# Patient Record
Sex: Male | Born: 1940 | Race: White | Hispanic: No | Smoking: Former smoker
Health system: Southern US, Community
[De-identification: ages and names within clinical notes are randomized; demographics above are authoritative.]

## PROBLEM LIST (undated history)

## (undated) DIAGNOSIS — F329 Major depressive disorder, single episode, unspecified: Secondary | ICD-10-CM

## (undated) DIAGNOSIS — I714 Abdominal aortic aneurysm, without rupture, unspecified: Secondary | ICD-10-CM

## (undated) DIAGNOSIS — I10 Essential (primary) hypertension: Secondary | ICD-10-CM

## (undated) DIAGNOSIS — M2010 Hallux valgus (acquired), unspecified foot: Secondary | ICD-10-CM

## (undated) DIAGNOSIS — F32A Depression, unspecified: Secondary | ICD-10-CM

## (undated) DIAGNOSIS — R51 Headache: Secondary | ICD-10-CM

## (undated) DIAGNOSIS — I2510 Atherosclerotic heart disease of native coronary artery without angina pectoris: Secondary | ICD-10-CM

## (undated) DIAGNOSIS — G459 Transient cerebral ischemic attack, unspecified: Secondary | ICD-10-CM

## (undated) DIAGNOSIS — K219 Gastro-esophageal reflux disease without esophagitis: Secondary | ICD-10-CM

## (undated) DIAGNOSIS — E785 Hyperlipidemia, unspecified: Secondary | ICD-10-CM

## (undated) DIAGNOSIS — K2270 Barrett's esophagus without dysplasia: Secondary | ICD-10-CM

## (undated) DIAGNOSIS — R519 Headache, unspecified: Secondary | ICD-10-CM

## (undated) DIAGNOSIS — J449 Chronic obstructive pulmonary disease, unspecified: Secondary | ICD-10-CM

## (undated) DIAGNOSIS — J45909 Unspecified asthma, uncomplicated: Secondary | ICD-10-CM

## (undated) HISTORY — PX: CORONARY STENT PLACEMENT: SHX1402

## (undated) HISTORY — PX: PROSTATE SURGERY: SHX751

## (undated) HISTORY — PX: SHOULDER SURGERY: SHX246

## (undated) HISTORY — PX: HERNIA REPAIR: SHX51

## (undated) HISTORY — PX: OTHER SURGICAL HISTORY: SHX169

## (undated) HISTORY — PX: UPPER GASTROINTESTINAL ENDOSCOPY: SHX188

## (undated) HISTORY — PX: CORONARY ANGIOPLASTY: SHX604

## (undated) HISTORY — PX: EYE SURGERY: SHX253

## (undated) HISTORY — PX: COLONOSCOPY: SHX174

---

## 2004-07-09 ENCOUNTER — Emergency Department: Payer: Self-pay | Admitting: Emergency Medicine

## 2004-07-11 ENCOUNTER — Emergency Department: Payer: Self-pay | Admitting: Unknown Physician Specialty

## 2009-10-03 ENCOUNTER — Ambulatory Visit: Payer: Self-pay | Admitting: Anesthesiology

## 2009-10-10 ENCOUNTER — Ambulatory Visit: Payer: Self-pay | Admitting: Podiatry

## 2010-01-08 ENCOUNTER — Ambulatory Visit: Payer: Self-pay | Admitting: Family Medicine

## 2010-02-14 ENCOUNTER — Ambulatory Visit: Payer: Self-pay | Admitting: Gastroenterology

## 2010-06-25 ENCOUNTER — Ambulatory Visit: Payer: Self-pay | Admitting: Cardiology

## 2011-07-10 ENCOUNTER — Ambulatory Visit: Payer: Self-pay | Admitting: Cardiology

## 2012-05-04 ENCOUNTER — Ambulatory Visit: Payer: Self-pay | Admitting: Gastroenterology

## 2012-05-17 ENCOUNTER — Ambulatory Visit: Payer: Self-pay | Admitting: Gastroenterology

## 2012-05-17 LAB — CBC WITH DIFFERENTIAL/PLATELET
Basophil %: 1.2 %
HGB: 14.2 g/dL (ref 13.0–18.0)
Lymphocyte %: 21.6 %
MCH: 33.7 pg (ref 26.0–34.0)
MCHC: 35.4 g/dL (ref 32.0–36.0)
MCV: 95 fL (ref 80–100)
Monocyte %: 12 %
Neutrophil #: 2.1 10*3/uL (ref 1.4–6.5)
Neutrophil %: 62.8 %
Platelet: 135 10*3/uL — ABNORMAL LOW (ref 150–440)
WBC: 3.3 10*3/uL — ABNORMAL LOW (ref 3.8–10.6)

## 2012-05-31 ENCOUNTER — Ambulatory Visit: Payer: Self-pay | Admitting: Oncology

## 2012-06-02 ENCOUNTER — Ambulatory Visit: Payer: Self-pay | Admitting: Oncology

## 2012-06-02 LAB — IRON AND TIBC
Iron Saturation: 32 %
Unbound Iron-Bind.Cap.: 261 ug/dL

## 2012-06-02 LAB — CBC CANCER CENTER
Basophil %: 0.8 %
Eosinophil %: 1.4 %
HCT: 40.8 % (ref 40.0–52.0)
HGB: 14.6 g/dL (ref 13.0–18.0)
Lymphocyte #: 0.9 x10 3/mm — ABNORMAL LOW (ref 1.0–3.6)
MCH: 33.9 pg (ref 26.0–34.0)
MCHC: 35.7 g/dL (ref 32.0–36.0)
MCV: 95 fL (ref 80–100)
Monocyte %: 9.7 %
Neutrophil %: 70.3 %
Platelet: 125 x10 3/mm — ABNORMAL LOW (ref 150–440)
RDW: 13.6 % (ref 11.5–14.5)

## 2012-06-02 LAB — FOLATE: Folic Acid: 8.6 ng/mL (ref 3.1–100.0)

## 2012-06-02 LAB — FERRITIN: Ferritin (ARMC): 282 ng/mL (ref 8–388)

## 2012-06-02 LAB — LACTATE DEHYDROGENASE: LDH: 163 U/L (ref 85–241)

## 2012-06-04 ENCOUNTER — Ambulatory Visit: Payer: Self-pay | Admitting: Oncology

## 2012-09-05 ENCOUNTER — Ambulatory Visit: Payer: Self-pay | Admitting: Oncology

## 2012-09-09 LAB — CBC CANCER CENTER
Eosinophil #: 0.1 x10 3/mm (ref 0.0–0.7)
Eosinophil %: 1.9 %
HCT: 38.6 % — ABNORMAL LOW (ref 40.0–52.0)
Lymphocyte %: 21.9 %
MCH: 34.7 pg — ABNORMAL HIGH (ref 26.0–34.0)
Monocyte #: 0.4 x10 3/mm (ref 0.2–1.0)
Neutrophil #: 2.8 x10 3/mm (ref 1.4–6.5)
Neutrophil %: 66.2 %
Platelet: 128 x10 3/mm — ABNORMAL LOW (ref 150–440)
WBC: 4.2 x10 3/mm (ref 3.8–10.6)

## 2012-10-04 ENCOUNTER — Ambulatory Visit: Payer: Self-pay | Admitting: Oncology

## 2012-10-12 ENCOUNTER — Ambulatory Visit: Payer: Self-pay | Admitting: Urology

## 2012-12-09 ENCOUNTER — Ambulatory Visit: Payer: Self-pay | Admitting: Internal Medicine

## 2012-12-14 ENCOUNTER — Ambulatory Visit: Payer: Self-pay | Admitting: Urology

## 2012-12-14 LAB — CBC WITH DIFFERENTIAL/PLATELET
HGB: 13.6 g/dL (ref 13.0–18.0)
Lymphocyte #: 0.9 10*3/uL — ABNORMAL LOW (ref 1.0–3.6)
Lymphocyte %: 19.6 %
MCH: 33.6 pg (ref 26.0–34.0)
MCHC: 35.3 g/dL (ref 32.0–36.0)
MCV: 95 fL (ref 80–100)
Monocyte #: 0.4 x10 3/mm (ref 0.2–1.0)
Monocyte %: 8.8 %
RBC: 4.04 10*6/uL — ABNORMAL LOW (ref 4.40–5.90)
RDW: 13.8 % (ref 11.5–14.5)

## 2012-12-19 ENCOUNTER — Ambulatory Visit: Payer: Self-pay | Admitting: Anesthesiology

## 2012-12-19 LAB — BASIC METABOLIC PANEL
BUN: 14 mg/dL (ref 7–18)
Calcium, Total: 8.6 mg/dL (ref 8.5–10.1)
Chloride: 107 mmol/L (ref 98–107)
Co2: 23 mmol/L (ref 21–32)
Creatinine: 0.79 mg/dL (ref 0.60–1.30)
EGFR (African American): >60
EGFR (Non-African Amer.): >60
Glucose: 103 mg/dL — ABNORMAL HIGH (ref 65–99)
Osmolality: 276 (ref 275–301)
Potassium: 4.1 mmol/L (ref 3.5–5.1)
Sodium: 138 mmol/L (ref 136–145)

## 2012-12-21 ENCOUNTER — Ambulatory Visit: Payer: Self-pay | Admitting: Urology

## 2012-12-22 LAB — BASIC METABOLIC PANEL
BUN: 16 mg/dL (ref 7–18)
Co2: 26 mmol/L (ref 21–32)
EGFR (African American): >60
EGFR (Non-African Amer.): >60
Glucose: 110 mg/dL — ABNORMAL HIGH (ref 65–99)
Potassium: 3.7 mmol/L (ref 3.5–5.1)
Sodium: 137 mmol/L (ref 136–145)

## 2013-01-09 ENCOUNTER — Observation Stay: Payer: Self-pay | Admitting: Emergency Medicine

## 2013-01-09 LAB — COMPREHENSIVE METABOLIC PANEL
Albumin: 3.8 g/dL (ref 3.4–5.0)
Alkaline Phosphatase: 98 U/L (ref 50–136)
BUN: 18 mg/dL (ref 7–18)
Bilirubin,Total: 0.6 mg/dL (ref 0.2–1.0)
Calcium, Total: 8.2 mg/dL — ABNORMAL LOW (ref 8.5–10.1)
Chloride: 109 mmol/L — ABNORMAL HIGH (ref 98–107)
Co2: 24 mmol/L (ref 21–32)
Creatinine: 1.16 mg/dL (ref 0.60–1.30)
EGFR (Non-African Amer.): >60
SGOT(AST): 29 U/L (ref 15–37)
SGPT (ALT): 40 U/L (ref 12–78)

## 2013-01-09 LAB — URINALYSIS, COMPLETE
Bacteria: NONE SEEN
Squamous Epithelial: NONE SEEN
WBC UR: NONE SEEN /HPF (ref 0–5)

## 2013-01-09 LAB — CBC
MCH: 34.3 pg — ABNORMAL HIGH (ref 26.0–34.0)
MCHC: 36 g/dL (ref 32.0–36.0)
MCV: 95 fL (ref 80–100)
RDW: 14.1 % (ref 11.5–14.5)

## 2013-01-09 LAB — PROTIME-INR: INR: 1.1

## 2013-01-10 LAB — HEMATOCRIT: HCT: 33.1 % — ABNORMAL LOW (ref 40.0–52.0)

## 2013-03-17 ENCOUNTER — Ambulatory Visit: Payer: Self-pay | Admitting: Oncology

## 2013-03-17 LAB — CBC CANCER CENTER
Basophil #: 0 x10 3/mm (ref 0.0–0.1)
HCT: 40.2 % (ref 40.0–52.0)
HGB: 13.7 g/dL (ref 13.0–18.0)
Lymphocyte #: 0.8 x10 3/mm — ABNORMAL LOW (ref 1.0–3.6)
Lymphocyte %: 23.4 %
MCH: 32.5 pg (ref 26.0–34.0)
Monocyte #: 0.6 x10 3/mm (ref 0.2–1.0)
Neutrophil %: 57.3 %
Platelet: 104 x10 3/mm — ABNORMAL LOW (ref 150–440)
RBC: 4.21 10*6/uL — ABNORMAL LOW (ref 4.40–5.90)
RDW: 13.3 % (ref 11.5–14.5)

## 2013-04-06 ENCOUNTER — Ambulatory Visit: Payer: Self-pay | Admitting: Oncology

## 2013-06-15 ENCOUNTER — Ambulatory Visit: Payer: Self-pay | Admitting: Oncology

## 2013-06-16 LAB — CBC CANCER CENTER
Basophil #: 0 x10 3/mm (ref 0.0–0.1)
Basophil %: 0.8 %
EOS PCT: 2.2 %
Eosinophil #: 0.1 x10 3/mm (ref 0.0–0.7)
HCT: 39.2 % — ABNORMAL LOW (ref 40.0–52.0)
HGB: 13.5 g/dL (ref 13.0–18.0)
Lymphocyte #: 0.9 x10 3/mm — ABNORMAL LOW (ref 1.0–3.6)
Lymphocyte %: 23.5 %
MCH: 32.3 pg (ref 26.0–34.0)
MCHC: 34.3 g/dL (ref 32.0–36.0)
MCV: 94 fL (ref 80–100)
Monocyte #: 0.4 x10 3/mm (ref 0.2–1.0)
Monocyte %: 12.3 %
NEUTROS PCT: 61.2 %
Neutrophil #: 2.2 x10 3/mm (ref 1.4–6.5)
Platelet: 115 x10 3/mm — ABNORMAL LOW (ref 150–440)
RBC: 4.17 10*6/uL — ABNORMAL LOW (ref 4.40–5.90)
RDW: 14.1 % (ref 11.5–14.5)
WBC: 3.6 x10 3/mm — AB (ref 3.8–10.6)

## 2013-07-05 ENCOUNTER — Ambulatory Visit: Payer: Self-pay | Admitting: Oncology

## 2013-09-22 ENCOUNTER — Ambulatory Visit: Payer: Self-pay | Admitting: Oncology

## 2013-09-22 LAB — CBC CANCER CENTER
BASOS ABS: 0 x10 3/mm (ref 0.0–0.1)
Basophil %: 1.1 %
Eosinophil #: 0.1 x10 3/mm (ref 0.0–0.7)
Eosinophil %: 1.8 %
HCT: 37.3 % — ABNORMAL LOW (ref 40.0–52.0)
HGB: 13 g/dL (ref 13.0–18.0)
Lymphocyte #: 0.9 x10 3/mm — ABNORMAL LOW (ref 1.0–3.6)
Lymphocyte %: 25.8 %
MCH: 33 pg (ref 26.0–34.0)
MCHC: 34.8 g/dL (ref 32.0–36.0)
MCV: 95 fL (ref 80–100)
Monocyte #: 0.4 x10 3/mm (ref 0.2–1.0)
Monocyte %: 11.4 %
NEUTROS ABS: 2.1 x10 3/mm (ref 1.4–6.5)
Neutrophil %: 59.9 %
Platelet: 120 x10 3/mm — ABNORMAL LOW (ref 150–440)
RBC: 3.93 10*6/uL — ABNORMAL LOW (ref 4.40–5.90)
RDW: 14 % (ref 11.5–14.5)
WBC: 3.4 x10 3/mm — ABNORMAL LOW (ref 3.8–10.6)

## 2013-10-04 ENCOUNTER — Ambulatory Visit: Payer: Self-pay | Admitting: Oncology

## 2014-03-23 ENCOUNTER — Ambulatory Visit: Payer: Self-pay | Admitting: Oncology

## 2014-03-23 LAB — CBC CANCER CENTER
Basophil #: 0 x10 3/mm (ref 0.0–0.1)
Basophil %: 1 %
Eosinophil #: 0.1 x10 3/mm (ref 0.0–0.7)
Eosinophil %: 2 %
HCT: 37.8 % — ABNORMAL LOW (ref 40.0–52.0)
HGB: 13 g/dL (ref 13.0–18.0)
Lymphocyte %: 26.4 %
Lymphs Abs: 0.9 x10 3/mm — ABNORMAL LOW (ref 1.0–3.6)
MCH: 32.7 pg (ref 26.0–34.0)
MCHC: 34.5 g/dL (ref 32.0–36.0)
MCV: 95 fL (ref 80–100)
Monocyte #: 0.3 x10 3/mm (ref 0.2–1.0)
Monocyte %: 10.4 %
Neutrophil #: 2 x10 3/mm (ref 1.4–6.5)
Neutrophil %: 60.2 %
Platelet: 125 x10 3/mm — ABNORMAL LOW (ref 150–440)
RBC: 3.98 x10 6/mm — ABNORMAL LOW (ref 4.40–5.90)
RDW: 14.1 % (ref 11.5–14.5)
WBC: 3.4 x10 3/mm — ABNORMAL LOW (ref 3.8–10.6)

## 2014-04-06 ENCOUNTER — Ambulatory Visit: Payer: Self-pay | Admitting: Oncology

## 2014-07-27 NOTE — H&P (Signed)
PATIENT NAME:  Paul Marquez, Paul Marquez MR#:  [id number] DATE OF BIRTH:  Feb 13, 1941  DATE OF ADMISSION:  01/09/2013  ADMISSION DIAGNOSES: 1.  Gross hematuria.  2.  Clot retention.   CHIEF COMPLAINT: Blood in urine.  HISTORY OF PRESENT ILLNESS: This is a 74 year old white male followed for BPH with lower urinary tract symptoms. He is status post TURP on 64/33/2951 without complications. He is on aspirin and Plavix and had started back both. He was doing well postoperatively. He states yesterday afternoon he was involved with some increased activity and lifting putting up shelves. He got up to void at approximately 1:00 this morning and had total gross painless hematuria. This progressed to the point where he was unable to urinate and presented to the Emergency Department. He had a three-way Foley catheter placed with several clots irrigated and was placed to continuous bladder irrigation. The hematuria did not clear when the CBI was stopped. He denies fever or chills. He had no flank pain. He did have abdominal pain prior to catheter placement.   PAST MEDICAL HISTORY: 1.  BPH with lower urinary tract symptoms.  2.  Hyperlipidemia.  3.  Migraines.  4.  Gastroesophageal reflux disease.  5.  Hypertension.  6.  TIAs. 7.  Coronary artery disease.  8.  Depression.   PAST SURGICAL HISTORY: 1.  Shoulder surgery.  2.  Transurethral microwave thermotherapy.  3.  TURP.  4.  Coronary angioplasty with stent placement.  5.  Herniorrhaphy.  6.  Back surgery.  SOCIAL HISTORY: No tobacco or alcohol use   MEDICATIONS ON ADMISSION: 1.  Vitamin B12. 2.  Simvastatin 20 mg daily.  3.  Potassium gluconate 1 tab daily. 4.  Plavix 75 mg daily. 5.  Oxybutynin extended release 15 mg daily.  6.  Omeprazole 20 mg daily. 7.  Tamsulosin 0.4 mg daily. 8.  Effexor XR 75 mg daily.  9.  ASA 81 mg daily. 10.  Amlodipine 5 mg daily.   ALLERGIES: ANACIN AND ZOLOFT.   REVIEW OF SYSTEMS:  CONSTITUTIONAL: Denies fevers,  chills.  HEENT: No headache, visual changes.  PULMONARY: No cough or shortness of breath. CARDIOVASCULAR: No chest pain, palpitations. GASTROINTESTINAL: No nausea or vomiting.  GENITOURINARY: As per the HPI. HEME: As per the HPI.  PSYCH: Depression.  Remainder of review of systems otherwise noncontributory, except as per the HPI.  PHYSICAL EXAMINATION: VITAL SIGNS: Temperature 97.9, pulse 60, BP 143/70.  GENERAL: Alert male in no acute distress.  HEENT: Oral cavity clear.  HEART: Regular rate and rhythm without murmur.  LUNGS: Clear to auscultation.  ABDOMEN: Soft and nontender without masses.  BACK: No CVA tenderness.  GENITOURINARY: Phallus without lesions. Testes are descended bilaterally. There is a three-way Foley catheter present draining blood-tinged urine on a moderate flow.   LABORATORY DATA: H and H 12.4 and 34.4. Creatinine 1.16.   IMPRESSION: Gross hematuria with clot retention status post TURP.   PLAN: The patient is admitted for continuous bladder irrigation. Presently, the CBI effluent is blood tinged. There is no surgical indication at this point.  ____________________________ Ronda Fairly. Bernardo Heater, MD scs:sb D: 01/09/2013 13:18:23 ET T: 01/09/2013 13:35:28 ET JOB#: [id number]  cc: Nicki Reaper C. Bernardo Heater, MD, <Dictator> Abbie Sons MD ELECTRONICALLY SIGNED 01/18/2013 12:34

## 2014-07-27 NOTE — Op Note (Signed)
PATIENT NAME:  Paul Marquez, GEHL MR#:  [id number] DATE OF BIRTH:  1940-07-06  DATE OF PROCEDURE:  12/21/2012  PREOPERATIVE DIAGNOSIS: Benign prostatic hypertrophy with lower urinary tract symptoms.   POSTOPERATIVE DIAGNOSIS: Benign prostatic hypertrophy with lower urinary tract symptoms.   PROCEDURE: Transurethral resection of the prostate.   SURGEON: Scott C. Bernardo Heater, M.D.   ASSISTANT: None.   ANESTHESIA: General.   INDICATIONS: A 74 year old male with a several year history of bothersome lower urinary tract symptoms. Symptoms include hesitancy, decreased stream, urinary frequency, urgency and occasional episodes of urge incontinence. He has been on tamsulosin, oxybutynin and Rapaflo with only mild improvement of his symptoms. Previous cystoscopy showed a large median lobe protruding into the bladder with only mild lateral lobe enlargement. He has elected to proceed with TURP.   DESCRIPTION OF PROCEDURE: The patient was taken to the cystoscopy suite and placed in the supine position. General anesthetic was administered, and he was then placed in the low lithotomy position. A timeout was performed per protocol with all in agreement. A 27-French continuous flow resectoscope sheath with visual obturator was lubricated and was advanced into the bladder without difficulty. The [facility] resectoscope with loop was then placed into the sheath. Prostatic urethra was closely examined. There was a median lobe protruding into the bladder. There was only mild lateral lobe enlargement. The bladder was trabeculated with cellules and small diverticula. The ureteral orifices were close to the median lobe, and there was clear efflux bilaterally. The median lobe was resected taking care to avoid involvement of the ureteral orifices or undermining the trigone. This was taken down to bladder neck fibers and with the exception working proximally towards the verumontanum. Hemostasis was obtained with cautery. The minimal  lateral lobe tissue was then resected from the bladder neck to the verumontanum. A small amount of anterior tissue was resected. All chips were removed via irrigation. At the completion of procedure, hemostasis was adequate, The scope was positioned in the verumontanum. The channel was wide open. A 20-French 3-way Foley catheter was placed without problems. There was light rosae effluent upon irrigation, and the catheter was placed to a low CBI. A and O suppository was placed per rectum. He was taken to the PACU in stable condition. There were no complications, and EBL was minimal.   ____________________________ Ronda Fairly. Bernardo Heater, MD scs:gb D: 12/21/2012 17:27:56 ET T: 12/21/2012 23:00:45 ET JOB#: [id number]  cc: Nicki Reaper C. Bernardo Heater, MD, <Dictator> Abbie Sons MD ELECTRONICALLY SIGNED 12/28/2012 10:17

## 2014-07-27 NOTE — Discharge Summary (Signed)
PATIENT NAME:  Paul Marquez, Paul Marquez MR#:  [id number] DATE OF BIRTH:  27-May-1940  DATE OF ADMISSION:  01/09/2013 DATE OF DISCHARGE:  01/12/2013  ADMISSION DIAGNOSES:  1.  Gross hematuria.  2.  Clot retention.   DISCHARGE DIAGNOSES: 1.  Gross hematuria.  2.  Clot retention.   PROCEDURES:  None.    HISTORY OF PRESENT ILLNESS: A 74 year old male status post TURP on 46/65/9935 without complication. He was on aspirin and Plavix for cerebrovascular and coronary artery disease and started the medication back postoperatively. He was doing well, however, on the day prior to admission, he was doing work involving heavy lifting. He awoke that evening complaining of total gross painless hematuria and inability to void. He had a Foley catheter placed in the Emergency Department with several clots irrigated and was placed on CBI which did not significantly clear. Refer to the admission history and physical for details.   HOSPITAL COURSE: The patient was admitted. His aspirin and Plavix were discontinued. He was kept on continuous bladder irrigation. His hematocrit on admission was 34.4. On the second hospital day, his CBI effluent was blood tinged. The CBI was continued and on the third hospital day his hematuria continued to improve although he had increased hematuria after a bowel movement. On the fourth hospital day his CBI effluent was clear and his CBI was discontinued without recurrent bleeding. His Foley catheter was removed that day and he was voiding without recurrent bleeding.   DISPOSITION: The patient discharged to home. He will hold his aspirin and Plavix for an additional 72 hours. He was stressed not to be involved in any heavy lifting or strenuous activity for at least another 2 weeks. He was instructed to contact the office for recurrent bleeding.   CONDITION AT DISCHARGE: Satisfactory.    ____________________________ Ronda Fairly Bernardo Heater, MD scs:dp D: 02/08/2013 08:32:00 ET T: 02/08/2013 08:44:29  ET JOB#: [id number]  cc: Nicki Reaper C. Bernardo Heater, MD, <Dictator> Abbie Sons MD ELECTRONICALLY SIGNED 02/15/2013 12:48

## 2015-08-05 ENCOUNTER — Emergency Department: Payer: Medicare Other

## 2015-08-05 ENCOUNTER — Emergency Department
Admission: EM | Admit: 2015-08-05 | Discharge: 2015-08-05 | Disposition: A | Discharge status: Home / Self Care | Payer: Medicare Other | Attending: Emergency Medicine | Admitting: Emergency Medicine

## 2015-08-05 ENCOUNTER — Encounter: Payer: Self-pay | Admitting: *Deleted

## 2015-08-05 DIAGNOSIS — R1031 Right lower quadrant pain: Secondary | ICD-10-CM | POA: Insufficient documentation

## 2015-08-05 DIAGNOSIS — R110 Nausea: Secondary | ICD-10-CM

## 2015-08-05 DIAGNOSIS — R197 Diarrhea, unspecified: Secondary | ICD-10-CM

## 2015-08-05 DIAGNOSIS — J45909 Unspecified asthma, uncomplicated: Secondary | ICD-10-CM | POA: Insufficient documentation

## 2015-08-05 DIAGNOSIS — I10 Essential (primary) hypertension: Secondary | ICD-10-CM | POA: Insufficient documentation

## 2015-08-05 DIAGNOSIS — R42 Dizziness and giddiness: Secondary | ICD-10-CM | POA: Diagnosis present

## 2015-08-05 HISTORY — DX: Essential (primary) hypertension: I10

## 2015-08-05 HISTORY — DX: Unspecified asthma, uncomplicated: J45.909

## 2015-08-05 LAB — CBC
HCT: 38.1 % — ABNORMAL LOW (ref 40.0–52.0)
Hemoglobin: 13.4 g/dL (ref 13.0–18.0)
MCH: 32.6 pg (ref 26.0–34.0)
MCHC: 35.2 g/dL (ref 32.0–36.0)
MCV: 92.6 fL (ref 80.0–100.0)
PLATELETS: 135 10*3/uL — AB (ref 150–440)
RBC: 4.11 MIL/uL — ABNORMAL LOW (ref 4.40–5.90)
RDW: 15.5 % — AB (ref 11.5–14.5)
WBC: 7.6 10*3/uL (ref 3.8–10.6)

## 2015-08-05 LAB — BASIC METABOLIC PANEL
Anion gap: 10 (ref 5–15)
BUN: 21 mg/dL — AB (ref 6–20)
CHLORIDE: 105 mmol/L (ref 101–111)
CO2: 20 mmol/L — ABNORMAL LOW (ref 22–32)
CREATININE: 1.05 mg/dL (ref 0.61–1.24)
Calcium: 9.1 mg/dL (ref 8.9–10.3)
GFR calc Af Amer: >60 mL/min (ref >60)
GFR calc non Af Amer: >60 mL/min (ref >60)
GLUCOSE: 116 mg/dL — AB (ref 65–99)
Potassium: 3.8 mmol/L (ref 3.5–5.1)
SODIUM: 135 mmol/L (ref 135–145)

## 2015-08-05 LAB — TROPONIN I: Troponin I: <0.03 ng/mL (ref <0.031)

## 2015-08-05 MED ORDER — DIATRIZOATE MEGLUMINE & SODIUM 66-10 % PO SOLN
15.0000 mL | Freq: Once | ORAL | Status: AC
  Administered 2015-08-05: 15 mL via ORAL
  Filled 2015-08-05: qty 30

## 2015-08-05 MED ORDER — IOPAMIDOL (ISOVUE-300) INJECTION 61%
100.0000 mL | Freq: Once | INTRAVENOUS | Status: AC | PRN
  Administered 2015-08-05: 100 mL via INTRAVENOUS

## 2015-08-05 MED ORDER — ONDANSETRON HCL 4 MG PO TABS: ORAL_TABLET | ORAL | Status: DC

## 2015-08-05 MED ORDER — SODIUM CHLORIDE 0.9 % IV BOLUS (SEPSIS)
500.0000 mL | INTRAVENOUS | Status: AC
  Administered 2015-08-05: 500 mL via INTRAVENOUS

## 2015-08-05 NOTE — ED Provider Notes (Signed)
[facility] Emergency Department Provider Note  ____________________________________________  Time seen: Approximately 5:05 PM  I have reviewed the triage vital signs and the nursing notes.   HISTORY  Chief Complaint Dizziness; Weakness; and Nausea    HPI Paul Marquez is a 75 y.o. male with a variety of complaints that have gradually occurred over the last 2 days.  His largest complaint is right lower quadrant abdominal pain which has been consistent and nothing makes it better or worse.  He has also had nausea thousand vomiting, several loose stools, subjective fever and chills, lack of appetite, and generalized weakness.  His symptoms are moderate in severity.  He denies chest pain, shortness of breath, dysuria.He has not had similar symptoms in the past.  He has a history of coronary artery disease status post stent placement, but is otherwise in good health for his age.   Past Medical History  Diagnosis Date  . Hypertension   . Asthma     There are no active problems to display for this patient.   Past Surgical History  Procedure Laterality Date  . Coronary stent placement      Current Outpatient Rx  Name  Route  Sig  Dispense  Refill  . ondansetron (ZOFRAN) 4 MG tablet      Take 1-2 tabs by mouth every 8 hours as needed for nausea/vomiting   30 tablet   0     Allergies Other and Zoloft  History reviewed. No pertinent family history.  Social History Social History  Substance Use Topics  . Smoking status: Never Smoker   . Smokeless tobacco: None  . Alcohol Use: None    Review of Systems Constitutional: Subjective fever/chills, general malaise Eyes: No visual changes. ENT: No sore throat. Cardiovascular: Denies chest pain. Respiratory: Denies shortness of breath. Gastrointestinal: RLQ abdominal pain.  Nausea, no vomiting.  Several loose stools.  No constipation. Genitourinary: Negative for dysuria. Musculoskeletal:  Negative for back pain. Skin: Negative for rash. Neurological: Negative for headaches, focal weakness or numbness.  10-point ROS otherwise negative.  ____________________________________________   PHYSICAL EXAM:  VITAL SIGNS: ED Triage Vitals  Enc Vitals Group     BP 08/05/15 1332 122/74 mmHg     Pulse Rate 08/05/15 1332 79     Resp 08/05/15 1332 18     Temp 08/05/15 1332 97.5 F (36.4 C)     Temp Source 08/05/15 1332 Oral     SpO2 08/05/15 1332 98 %     Weight 08/05/15 1332 204 lb (92.534 kg)     Height 08/05/15 1332 5\' 10"  (1.778 m)     Head Cir --      Peak Flow --      Pain Score 08/05/15 1333 7     Pain Loc --      Pain Edu? --      Excl. in [place]? --     Constitutional: Alert and oriented. Well appearing and in no acute distress. Eyes: Conjunctivae are normal. PERRL. EOMI. Head: Atraumatic. Nose: No congestion/rhinnorhea. Mouth/Throat: Mucous membranes are moist.  Oropharynx non-erythematous. Neck: No stridor.  No meningeal signs.   Cardiovascular: Normal rate, regular rhythm. Good peripheral circulation. Grossly normal heart sounds.   Respiratory: Normal respiratory effort.  No retractions. Lungs CTAB. Gastrointestinal: Soft With tenderness to palpation of the right lower quadrant with no rebound or guarding Musculoskeletal: No lower extremity tenderness nor edema. No gross deformities of extremities. Neurologic:  Normal speech and language. No gross  focal neurologic deficits are appreciated.  Skin:  Skin is warm, dry and intact. No rash noted. Psychiatric: Mood and affect are normal. Speech and behavior are normal.  ____________________________________________   LABS (all labs ordered are listed, but only abnormal results are displayed)  Labs Reviewed  BASIC METABOLIC PANEL - Abnormal; Notable for the following:    CO2 20 (*)    Glucose, Bld 116 (*)    BUN 21 (*)    All other components within normal limits  CBC - Abnormal; Notable for the following:     RBC 4.11 (*)    HCT 38.1 (*)    RDW 15.5 (*)    Platelets 135 (*)    All other components within normal limits  TROPONIN I  URINALYSIS COMPLETEWITH MICROSCOPIC ([facility] ONLY)   ____________________________________________  EKG  ED ECG REPORT I, Jahlia Omura, the attending physician, personally viewed and interpreted this ECG.   Date: 08/05/2015  EKG Time: 13:31  Rate: 76  Rhythm: atrial fibrillation, rate 76  Axis: normal  Intervals:a-fib w/ normal QTc  ST&T Change: Non-specific ST segment / T-wave changes, but no evidence of acute ischemia.   ____________________________________________  RADIOLOGY   Dg Chest 2 View  08/05/2015  CLINICAL DATA:  Chest pain, weakness, nausea, lack of appetite, chills and sweats for 2 days, new onset atrial fibrillation, slight shortness of breath EXAM: CHEST  2 VIEW COMPARISON:  None available FINDINGS: Normal heart size, mediastinal contours, and pulmonary vascularity. Calcified granuloma lateral lower RIGHT lung. Minimal bronchitic changes. No pulmonary infiltrate, pleural effusion or pneumothorax. Probable LEFT nipple shadow. Bones demineralized with degenerative changes RIGHT AC joint. IMPRESSION: Minimal bronchitic changes without infiltrate. Electronically Signed   By: Lavonia Dana M.D.   On: 08/05/2015 14:08   Ct Abdomen Pelvis W Contrast  08/05/2015  CLINICAL DATA:  Right lower quadrant abdominal pain for the past 2 days. Weakness. Nausea. Lack of appetite. Chills. Diaphoresis for 2 days. EXAM: CT ABDOMEN AND PELVIS WITH CONTRAST TECHNIQUE: Multidetector CT imaging of the abdomen and pelvis was performed using the standard protocol following bolus administration of intravenous contrast. CONTRAST:  176mL ISOVUE-300 IOPAMIDOL (ISOVUE-300) INJECTION 61% COMPARISON:  05/04/2012. FINDINGS: Lower chest: Minimal linear atelectasis or scarring at both lung bases. Hepatobiliary: Stable small liver cysts. Normal appearing gallbladder. Pancreas: No mass or  inflammatory process identified on this un-enhanced exam. Spleen: Within normal limits in size and appearance. Adrenals/Urinary Tract: 2.2 x 1.9 cm fat density mass in the left adrenal gland, previously measuring 1.7 x 1.5 cm. Normal appearing right adrenal gland. Small right renal cyst. Normal appearing left kidney, ureters and urinary bladder. Stomach/Bowel: No gastrointestinal abnormalities. Normal appearing appendix. Vascular/Lymphatic: Atheromatous arterial calcifications. No enlarged lymph nodes. Reproductive: Mildly enlarged prostate gland with a central TUR defect. Other: None. Musculoskeletal: Lumbar and lower thoracic spine degenerative changes. 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 IMPRESSION: 1. No acute abnormality. 2. 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 Electronically Signed   By: Claudie Revering M.D.   On: 08/05/2015 18:58    ____________________________________________   PROCEDURES  Procedure(s) performed: None  Critical Care performed: No ____________________________________________   INITIAL IMPRESSION / ASSESSMENT AND PLAN / ED COURSE  Pertinent labs & imaging results that were available during my care of the patient were reviewed by me and considered in my medical decision making (see chart for details).  Given the patient's constellation of symptoms and his physical exam findings of right lower quadrant pain, he  wanted to make sure that he did not have appendicitis, and I felt it was appropriate given that he is a stoic appearing man does not typically come to the emergency department.  His CT scan was reassuring as were his labs.  Believe the most likely is suffering from a gastrointestinal viral illness and I discussed this with the patient and the family.  He is feeling better at this time.  I gave him a small fluid bolus given his IV contrast and that may have also helped his overall feelings of generalized fatigue and  weakness.  I gave my usual and customary return precautions.      ____________________________________________  FINAL CLINICAL IMPRESSION(S) / ED DIAGNOSES  Final diagnoses:  RLQ abdominal pain  Nausea  Diarrhea, unspecified type     MEDICATIONS GIVEN AND/OR PRESCRIBED DURING THIS VISIT:  Medications  sodium chloride 0.9 % bolus 500 mL (0 mLs Intravenous Stopped 08/05/15 1914)  diatrizoate meglumine-sodium (GASTROGRAFIN) 66-10 % solution 15 mL (15 mLs Oral Given 08/05/15 1729)  iopamidol (ISOVUE-300) 61 % injection 100 mL (100 mLs Intravenous Contrast Given 08/05/15 1831)     NEW OUTPATIENT MEDICATIONS STARTED DURING THIS VISIT:  New Prescriptions   ONDANSETRON (ZOFRAN) 4 MG TABLET    Take 1-2 tabs by mouth every 8 hours as needed for nausea/vomiting      Note:  This document was prepared using Dragon voice recognition software and may include unintentional dictation errors.   Hinda Kehr, MD 08/05/15 [phone]

## 2015-08-05 NOTE — Discharge Instructions (Signed)
We believe your symptoms are caused by either a viral infection or possible a bad food exposure.  Either way, since your symptoms have improved, we feel it is safe for you to go home and follow up with your regular doctor.  Please read the included information and stick to a bland diet for the next two days.  Drink plenty of clear fluids, and if you were provided with a prescription, please take it according to the label instructions.    If you develop any new or worsening symptoms, including persistent vomiting not controlled with medication, fever greater than 101, severe or worsening abdominal pain, or other symptoms that concern you, please return immediately to the Emergency Department.   Abdominal Pain, Adult Many things can cause abdominal pain. Usually, abdominal pain is not caused by a disease and will improve without treatment. It can often be observed and treated at home. Your health care provider will do a physical exam and possibly order blood tests and X-rays to help determine the seriousness of your pain. However, in many cases, more time must pass before a clear cause of the pain can be found. Before that point, your health care provider may not know if you need more testing or further treatment. HOME CARE INSTRUCTIONS Monitor your abdominal pain for any changes. The following actions may help to alleviate any discomfort you are experiencing:  Only take over-the-counter or prescription medicines as directed by your health care provider.  Do not take laxatives unless directed to do so by your health care provider.  Try a clear liquid diet (broth, tea, or water) as directed by your health care provider. Slowly move to a bland diet as tolerated. SEEK MEDICAL CARE IF:  You have unexplained abdominal pain.  You have abdominal pain associated with nausea or diarrhea.  You have pain when you urinate or have a bowel movement.  You experience abdominal pain that wakes you in the  night.  You have abdominal pain that is worsened or improved by eating food.  You have abdominal pain that is worsened with eating fatty foods.  You have a fever. SEEK IMMEDIATE MEDICAL CARE IF:  Your pain does not go away within 2 hours.  You keep throwing up (vomiting).  Your pain is felt only in portions of the abdomen, such as the right side or the left lower portion of the abdomen.  You pass bloody or black tarry stools. MAKE SURE YOU:  Understand these instructions.  Will watch your condition.  Will get help right away if you are not doing well or get worse.   This information is not intended to replace advice given to you by your health care provider. Make sure you discuss any questions you have with your health care provider.   Document Released: 12/31/2004 Document Revised: 12/12/2014 Document Reviewed: 11/30/2012 Elsevier Interactive Patient Education 2016 Elsevier Inc[employer]  Nausea, Adult Nausea is the feeling that you have an upset stomach or have to vomit. Nausea by itself is not likely a serious concern, but it may be an early sign of more serious medical problems. As nausea gets worse, it can lead to vomiting. If vomiting develops, there is the risk of dehydration.  CAUSES   Viral infections.  Food poisoning.  Medicines.  Pregnancy.  Motion sickness.  Migraine headaches.  Emotional distress.  Severe pain from any source.  Alcohol intoxication. HOME CARE INSTRUCTIONS  Get plenty of rest.  Ask your caregiver about specific rehydration instructions.  Eat small  amounts of food and sip liquids more often.  Take all medicines as told by your caregiver. SEEK MEDICAL CARE IF:  You have not improved after 2 days, or you get worse.  You have a headache. SEEK IMMEDIATE MEDICAL CARE IF:   You have a fever.  You faint.  You keep vomiting or have blood in your vomit.  You are extremely weak or dehydrated.  You have dark or bloody stools.  You  have severe chest or abdominal pain. MAKE SURE YOU:  Understand these instructions.  Will watch your condition.  Will get help right away if you are not doing well or get worse.   This information is not intended to replace advice given to you by your health care provider. Make sure you discuss any questions you have with your health care provider.   Document Released: 04/30/2004 Document Revised: 04/13/2014 Document Reviewed: 12/03/2010 Elsevier Interactive Patient Education 2016 [employer].  Diarrhea Diarrhea is frequent loose and watery bowel movements. It can cause you to feel weak and dehydrated. Dehydration can cause you to become tired and thirsty, have a dry mouth, and have decreased urination that often is dark yellow. Diarrhea is a sign of another problem, most often an infection that will not last long. In most cases, diarrhea typically lasts 2-3 days. However, it can last longer if it is a sign of something more serious. It is important to treat your diarrhea as directed by your caregiver to lessen or prevent future episodes of diarrhea. CAUSES  Some common causes include:  Gastrointestinal infections caused by viruses, bacteria, or parasites.  Food poisoning or food allergies.  Certain medicines, such as antibiotics, chemotherapy, and laxatives.  Artificial sweeteners and fructose.  Digestive disorders. HOME CARE INSTRUCTIONS  Ensure adequate fluid intake (hydration): Have 1 cup (8 oz) of fluid for each diarrhea episode. Avoid fluids that contain simple sugars or sports drinks, fruit juices, whole milk products, and sodas. Your urine should be clear or pale yellow if you are drinking enough fluids. Hydrate with an oral rehydration solution that you can purchase at pharmacies, retail stores, and online. You can prepare an oral rehydration solution at home by mixing the following ingredients together:   - tsp table salt.   tsp baking soda.   tsp salt substitute  containing potassium chloride.  1  tablespoons sugar.  1 L (34 oz) of water.  Certain foods and beverages may increase the speed at which food moves through the gastrointestinal (GI) tract. These foods and beverages should be avoided and include:  Caffeinated and alcoholic beverages.  High-fiber foods, such as raw fruits and vegetables, nuts, seeds, and whole grain breads and cereals.  Foods and beverages sweetened with sugar alcohols, such as xylitol, sorbitol, and mannitol.  Some foods may be well tolerated and may help thicken stool including:  Starchy foods, such as rice, toast, pasta, low-sugar cereal, oatmeal, grits, baked potatoes, crackers, and bagels.  Bananas.  Applesauce.  Add probiotic-rich foods to help increase healthy bacteria in the GI tract, such as yogurt and fermented milk products.  Wash your hands well after each diarrhea episode.  Only take over-the-counter or prescription medicines as directed by your caregiver.  Take a warm bath to relieve any burning or pain from frequent diarrhea episodes. SEEK IMMEDIATE MEDICAL CARE IF:   You are unable to keep fluids down.  You have persistent vomiting.  You have blood in your stool, or your stools are black and tarry.  You do  not urinate in 6-8 hours, or there is only a small amount of very dark urine.  You have abdominal pain that increases or localizes.  You have weakness, dizziness, confusion, or light-headedness.  You have a severe headache.  Your diarrhea gets worse or does not get better.  You have a fever or persistent symptoms for more than 2-3 days.  You have a fever and your symptoms suddenly get worse. MAKE SURE YOU:   Understand these instructions.  Will watch your condition.  Will get help right away if you are not doing well or get worse.   This information is not intended to replace advice given to you by your health care provider. Make sure you discuss any questions you have with  your health care provider.   Document Released: 03/13/2002 Document Revised: 04/13/2014 Document Reviewed: 11/29/2011 Elsevier Interactive Patient Education 2016 [place] Choices to Help Relieve Diarrhea, Adult When you have diarrhea, the foods you eat and your eating habits are very important. Choosing the right foods and drinks can help relieve diarrhea. Also, because diarrhea can last up to 7 days, you need to replace lost fluids and electrolytes (such as sodium, potassium, and chloride) in order to help prevent dehydration.  WHAT GENERAL GUIDELINES DO I NEED TO FOLLOW?  Slowly drink 1 cup (8 oz) of fluid for each episode of diarrhea. If you are getting enough fluid, your urine will be clear or pale yellow.  Eat starchy foods. Some good choices include white rice, white toast, pasta, low-fiber cereal, baked potatoes (without the skin), saltine crackers, and bagels.  Avoid large servings of any cooked vegetables.  Limit fruit to two servings per day. A serving is  cup or 1 small piece.  Choose foods with less than 2 g of fiber per serving.  Limit fats to less than 8 tsp (38 g) per day.  Avoid fried foods.  Eat foods that have probiotics in them. Probiotics can be found in certain dairy products.  Avoid foods and beverages that may increase the speed at which food moves through the stomach and intestines (gastrointestinal tract). Things to avoid include:  High-fiber foods, such as dried fruit, raw fruits and vegetables, nuts, seeds, and whole grain foods.  Spicy foods and high-fat foods.  Foods and beverages sweetened with high-fructose corn syrup, honey, or sugar alcohols such as xylitol, sorbitol, and mannitol. WHAT FOODS ARE RECOMMENDED? Grains White rice. White, [place], or pita breads (fresh or toasted), including plain rolls, buns, or bagels. White pasta. Saltine, soda, or graham crackers. Pretzels. Low-fiber cereal. Cooked cereals made with water (such as  cornmeal, farina, or cream cereals). Plain muffins. Matzo. Melba toast. Zwieback.  Vegetables Potatoes (without the skin). Strained tomato and vegetable juices. Most well-cooked and canned vegetables without seeds. Tender lettuce. Fruits Cooked or canned applesauce, apricots, cherries, fruit cocktail, grapefruit, peaches, pears, or plums. Fresh bananas, apples without skin, cherries, grapes, cantaloupe, grapefruit, peaches, oranges, or plums.  Meat and Other Protein Products Baked or boiled chicken. Eggs. Tofu. Fish. Seafood. Smooth peanut butter. Ground or well-cooked tender beef, ham, veal, lamb, pork, or poultry.  Dairy Plain yogurt, kefir, and unsweetened liquid yogurt. Lactose-free milk, buttermilk, or soy milk. Plain hard cheese. Beverages Sport drinks. Clear broths. Diluted fruit juices (except prune). Regular, caffeine-free sodas such as ginger ale. Water. Decaffeinated teas. Oral rehydration solutions. Sugar-free beverages not sweetened with sugar alcohols. Other Bouillon, broth, or soups made from recommended foods.  The items listed above may not be  a complete list of recommended foods or beverages. Contact your dietitian for more options. WHAT FOODS ARE NOT RECOMMENDED? Grains Whole grain, whole wheat, bran, or rye breads, rolls, pastas, crackers, and cereals. Wild or brown rice. Cereals that contain more than 2 g of fiber per serving. Corn tortillas or taco shells. Cooked or dry oatmeal. Granola. Popcorn. Vegetables Raw vegetables. Cabbage, broccoli, Brussels sprouts, artichokes, baked beans, beet greens, corn, kale, legumes, peas, sweet potatoes, and yams. Potato skins. Cooked spinach and cabbage. Fruits Dried fruit, including raisins and dates. Raw fruits. Stewed or dried prunes. Fresh apples with skin, apricots, mangoes, pears, raspberries, and strawberries.  Meat and Other Protein Products Chunky peanut butter. Nuts and seeds. Beans and lentils. Berniece Salines.  Dairy High-fat  cheeses. Milk, chocolate milk, and beverages made with milk, such as milk shakes. Cream. Ice cream. Sweets and Desserts Sweet rolls, doughnuts, and sweet breads. Pancakes and waffles. Fats and Oils Butter. Cream sauces. Margarine. Salad oils. Plain salad dressings. Olives. Avocados.  Beverages Caffeinated beverages (such as coffee, tea, soda, or energy drinks). Alcoholic beverages. Fruit juices with pulp. Prune juice. Soft drinks sweetened with high-fructose corn syrup or sugar alcohols. Other Coconut. Hot sauce. Chili powder. Mayonnaise. Gravy. Cream-based or milk-based soups.  The items listed above may not be a complete list of foods and beverages to avoid. Contact your dietitian for more information. WHAT SHOULD I DO IF I BECOME DEHYDRATED? Diarrhea can sometimes lead to dehydration. Signs of dehydration include dark urine and dry mouth and skin. If you think you are dehydrated, you should rehydrate with an oral rehydration solution. These solutions can be purchased at pharmacies, retail stores, or online.  Drink -1 cup (120-240 mL) of oral rehydration solution each time you have an episode of diarrhea. If drinking this amount makes your diarrhea worse, try drinking smaller amounts more often. For example, drink 1-3 tsp (5-15 mL) every 5-10 minutes.  A general rule for staying hydrated is to drink 1-2 L of fluid per day. Talk to your health care provider about the specific amount you should be drinking each day. Drink enough fluids to keep your urine clear or pale yellow.   This information is not intended to replace advice given to you by your health care provider. Make sure you discuss any questions you have with your health care provider.   Document Released: 06/13/2003 Document Revised: 04/13/2014 Document Reviewed: 02/13/2013 Elsevier Interactive Patient Education [employer].

## 2015-08-05 NOTE — ED Notes (Signed)
MD Forbach at bedside. 

## 2015-08-05 NOTE — ED Notes (Signed)
States feeling weak, nauseas, lack of appetite, chills and sweats for 2 days, denies any CP

## 2016-01-10 ENCOUNTER — Emergency Department: Payer: Medicare Other

## 2016-01-10 ENCOUNTER — Emergency Department
Admission: EM | Admit: 2016-01-10 | Discharge: 2016-01-10 | Disposition: A | Discharge status: Home / Self Care | Payer: Medicare Other | Attending: Emergency Medicine | Admitting: Emergency Medicine

## 2016-01-10 ENCOUNTER — Encounter: Payer: Self-pay | Admitting: Emergency Medicine

## 2016-01-10 DIAGNOSIS — Z7982 Long term (current) use of aspirin: Secondary | ICD-10-CM | POA: Insufficient documentation

## 2016-01-10 DIAGNOSIS — I10 Essential (primary) hypertension: Secondary | ICD-10-CM | POA: Insufficient documentation

## 2016-01-10 DIAGNOSIS — H81399 Other peripheral vertigo, unspecified ear: Secondary | ICD-10-CM | POA: Diagnosis not present

## 2016-01-10 DIAGNOSIS — J45909 Unspecified asthma, uncomplicated: Secondary | ICD-10-CM | POA: Diagnosis not present

## 2016-01-10 DIAGNOSIS — Z79899 Other long term (current) drug therapy: Secondary | ICD-10-CM | POA: Diagnosis not present

## 2016-01-10 DIAGNOSIS — R42 Dizziness and giddiness: Secondary | ICD-10-CM | POA: Diagnosis present

## 2016-01-10 LAB — URINALYSIS COMPLETE WITH MICROSCOPIC (ARMC ONLY)
Bilirubin Urine: NEGATIVE
Glucose, UA: NEGATIVE mg/dL
HGB URINE DIPSTICK: NEGATIVE
LEUKOCYTES UA: NEGATIVE
NITRITE: NEGATIVE
PH: 6 (ref 5.0–8.0)
PROTEIN: NEGATIVE mg/dL
SPECIFIC GRAVITY, URINE: 1.017 (ref 1.005–1.030)
Squamous Epithelial / LPF: NONE SEEN

## 2016-01-10 LAB — BASIC METABOLIC PANEL
ANION GAP: 5 (ref 5–15)
BUN: 24 mg/dL — ABNORMAL HIGH (ref 6–20)
CALCIUM: 8.5 mg/dL — AB (ref 8.9–10.3)
CO2: 24 mmol/L (ref 22–32)
CREATININE: 0.95 mg/dL (ref 0.61–1.24)
Chloride: 108 mmol/L (ref 101–111)
Glucose, Bld: 143 mg/dL — ABNORMAL HIGH (ref 65–99)
Potassium: 3.9 mmol/L (ref 3.5–5.1)
SODIUM: 137 mmol/L (ref 135–145)

## 2016-01-10 LAB — CBC
HCT: 34.2 % — ABNORMAL LOW (ref 40.0–52.0)
Hemoglobin: 12 g/dL — ABNORMAL LOW (ref 13.0–18.0)
MCH: 32.7 pg (ref 26.0–34.0)
MCHC: 35.1 g/dL (ref 32.0–36.0)
MCV: 93 fL (ref 80.0–100.0)
PLATELETS: 100 10*3/uL — AB (ref 150–440)
RBC: 3.68 MIL/uL — AB (ref 4.40–5.90)
RDW: 15.3 % — ABNORMAL HIGH (ref 11.5–14.5)
WBC: 5.8 10*3/uL (ref 3.8–10.6)

## 2016-01-10 MED ORDER — MECLIZINE HCL 25 MG PO TABS
25.0000 mg | ORAL_TABLET | Freq: Once | ORAL | Status: AC
  Administered 2016-01-10: 25 mg via ORAL
  Filled 2016-01-10: qty 1

## 2016-01-10 MED ORDER — ONDANSETRON HCL 4 MG/2ML IJ SOLN
4.0000 mg | Freq: Once | INTRAMUSCULAR | Status: AC
  Administered 2016-01-10: 4 mg via INTRAVENOUS
  Filled 2016-01-10: qty 2

## 2016-01-10 MED ORDER — ONDANSETRON HCL 4 MG PO TABS: 4.0000 mg | ORAL_TABLET | Freq: Three times a day (TID) | ORAL | 0 refills | Status: DC | PRN

## 2016-01-10 MED ORDER — VENLAFAXINE HCL ER 75 MG PO CP24
150.0000 mg | ORAL_CAPSULE | Freq: Once | ORAL | Status: AC
  Administered 2016-01-10: 150 mg via ORAL
  Filled 2016-01-10: qty 2

## 2016-01-10 MED ORDER — MECLIZINE HCL 25 MG PO TABS: 25.0000 mg | ORAL_TABLET | Freq: Three times a day (TID) | ORAL | 0 refills | Status: DC | PRN

## 2016-01-10 MED ORDER — CLOPIDOGREL BISULFATE 75 MG PO TABS
150.0000 mg | ORAL_TABLET | Freq: Once | ORAL | Status: AC
  Administered 2016-01-10: 150 mg via ORAL
  Filled 2016-01-10: qty 2

## 2016-01-10 NOTE — Discharge Instructions (Signed)
You were evaluated for room spinning and nausea and as we discussed I think this is due to vertigo.  Your examine evaluation are reassuring in the emergency department today. Return to the emergency room for any worsening symptoms especially any confusion or altered mental status, weakness or numbness, passing out, chest pain, fever or any other symptoms concerning to you.  If symptoms are not better in one week, I would recommend that he see an ears nose throat specialist, referred to Dr. Charolett Bumpers.

## 2016-01-10 NOTE — ED Notes (Signed)
Orthostatic BPs attempted. Pt unable to tolerate them. When he was laid flat on the stretcher, had N/V/ Pt immediately placed in upright position.

## 2016-01-10 NOTE — ED Notes (Signed)
Patient transported to CT 

## 2016-01-10 NOTE — ED Provider Notes (Signed)
[facility] Emergency Department Provider Note ____________________________________________   I have reviewed the triage vital signs and the triage nursing note.  HISTORY  Chief Complaint Dizziness   Historian Patient and spouse and daughters  HPI Paul Marquez is a 75 y.o. male reports that he woke up this morning when he sat straight up he became dizzy and described this as room spinning to the point where he became nauseated and spit up. Symptoms have persisted this morning and are worse when he lays down or gets up. No vomiting blood. No fever. No neck stiffness. No headache. No one-sided weakness or numbness. No slurred speech. No symptoms of peripheral vertigo in the past. He does take Plavix due to history of a stent.    Past Medical History:  Diagnosis Date  . Asthma   . Hypertension     There are no active problems to display for this patient.   Past Surgical History:  Procedure Laterality Date  . CORONARY STENT PLACEMENT      Prior to Admission medications   Medication Sig Start Date End Date Taking? Authorizing Provider  amLODipine (NORVASC) 10 MG tablet Take 1 tablet by mouth daily. 12/14/15  Yes Historical Provider, MD  aspirin EC 81 MG tablet Take 1 tablet by mouth daily.   Yes Historical Provider, MD  clopidogrel (PLAVIX) 75 MG tablet Take 1 tablet by mouth daily. 07/10/10  Yes Historical Provider, MD  cyanocobalamin 1000 MCG tablet Take 1,000 mcg by mouth daily.   Yes Historical Provider, MD  EQL NATURAL ZINC 50 MG TABS Take 1 tablet by mouth daily.   Yes Historical Provider, MD  omeprazole (PRILOSEC) 20 MG capsule Take 1 capsule by mouth daily. 10/24/15  Yes Historical Provider, MD  oxybutynin (DITROPAN) 5 MG tablet Take 1 tablet by mouth 3 (three) times daily. 12/16/15  Yes Historical Provider, MD  Potassium Gluconate ([employer] POTASSIUM GLUCONATE) 2 MEQ TABS Take 1 tablet by mouth daily.   Yes Historical Provider, MD  simvastatin (ZOCOR) 20 MG  tablet Take 1 tablet by mouth daily. 10/14/15  Yes Historical Provider, MD  venlafaxine XR (EFFEXOR-XR) 150 MG 24 hr capsule Take 1 capsule by mouth daily. 11/04/15 11/03/16 Yes Historical Provider, MD  meclizine (ANTIVERT) 25 MG tablet Take 1 tablet (25 mg total) by mouth 3 (three) times daily as needed for dizziness or nausea. 01/10/16   Lisa Roca, MD  ondansetron (ZOFRAN) 4 MG tablet Take 1 tablet (4 mg total) by mouth every 8 (eight) hours as needed for nausea or vomiting. 01/10/16   Lisa Roca, MD    Allergies  Allergen Reactions  . Other     ansyn  . Zoloft [Sertraline]     History reviewed. No pertinent family history.  Social History Social History  Substance Use Topics  . Smoking status: Never Smoker  . Smokeless tobacco: Never Used  . Alcohol use Not on file    Review of Systems  Constitutional: Negative for fever. Eyes: Negative for visual changes. ENT: Negative for sore throat. Cardiovascular: Negative for chest pain. Respiratory: Negative for shortness of breath. Gastrointestinal: Negative for abdominal pain, vomiting and diarrhea. Genitourinary: Negative for dysuria. Musculoskeletal: Negative for back pain. Skin: Negative for rash. Neurological: Negative for headache. 10 point Review of Systems otherwise negative ____________________________________________   PHYSICAL EXAM:  VITAL SIGNS: ED Triage Vitals  Enc Vitals Group     BP 01/10/16 0954 (!) 117/57     Pulse Rate 01/10/16 0954 (!) 49  Resp --      Temp 01/10/16 0954 97.5 F (36.4 C)     Temp Source 01/10/16 0954 Oral     SpO2 01/10/16 0954 97 %     Weight 01/10/16 0955 183 lb (83 kg)     Height 01/10/16 0955 5\' 10"  (1.778 m)     Head Circumference --      Peak Flow --      Pain Score --      Pain Loc --      Pain Edu? --      Excl. in [place]? --      Constitutional: Alert and oriented. Well appearing and in no distress. HEENT   Head: Normocephalic and atraumatic.      Eyes:  Conjunctivae are normal. PERRL. Normal extraocular movements.      Ears:         Nose: No congestion/rhinnorhea.   Mouth/Throat: Mucous membranes are moist.   Neck: No stridor. Cardiovascular/Chest: Normal rate, regular rhythm.  No murmurs, rubs, or gallops. Respiratory: Normal respiratory effort without tachypnea nor retractions. Breath sounds are clear and equal bilaterally. No wheezes/rales/rhonchi. Gastrointestinal: Soft. No distention, no guarding, no rebound. Nontender.    Genitourinary/rectal:Deferred Musculoskeletal: Nontender with normal range of motion in all extremities. No joint effusions.  No lower extremity tenderness.  No edema. Neurologic:  No facial droop. Counter 2 through 10 intact. Normal speech and language. No gross or focal neurologic deficits are appreciated.  5 out of 5 strength in 4 extremities. Heel to shin intact bilaterally. Finger to nose intact bilaterally. No pronator drift. Nystagmus and onset of nausea and room spinning upon lying flat and turning the head to the right. Skin:  Skin is warm, dry and intact. No rash noted. Psychiatric: Mood and affect are normal. Speech and behavior are normal. Patient exhibits appropriate insight and judgment.   ____________________________________________  LABS (pertinent positives/negatives)  Labs Reviewed  BASIC METABOLIC PANEL - Abnormal; Notable for the following:       Result Value   Glucose, Bld 143 (*)    BUN 24 (*)    Calcium 8.5 (*)    All other components within normal limits  CBC - Abnormal; Notable for the following:    RBC 3.68 (*)    Hemoglobin 12.0 (*)    HCT 34.2 (*)    RDW 15.3 (*)    Platelets 100 (*)    All other components within normal limits  URINALYSIS COMPLETEWITH MICROSCOPIC ([facility] ONLY) - Abnormal; Notable for the following:    Color, Urine YELLOW (*)    APPearance CLEAR (*)    Ketones, ur TRACE (*)    Bacteria, UA RARE (*)    All other components within normal limits     ____________________________________________    EKG I, Lisa Roca, MD, the attending physician have personally viewed and interpreted all ECGs.  50 bpm. Normal sinus rhythm. Right bundle branch block. Normal ST and T-wave ____________________________________________  RADIOLOGY All Xrays were viewed by me. Imaging interpreted by Radiologist.  CT head without contrast:  IMPRESSION: 1.  No evidence of acute intracranial abnormality. 2. Mild chronic small vessel ischemia. __________________________________________  PROCEDURES  Procedure(s) performed: None  Critical Care performed: None  ____________________________________________   ED COURSE / ASSESSMENT AND PLAN  Pertinent labs & imaging results that were available during my care of the patient were reviewed by me and considered in my medical decision making (see chart for details).   Mr. Sassaman came in for dizziness  with nausea, and describes this as room spinning. His symptoms certainly sound like peripheral vertigo. On neurologic exam he does not have any neurologic deficits highly concerning for cerebellar or posterior circulation issues.  Given his age, and lack of prior CT head imaging, I did discuss with he and his family obtaining a CT head imaging and we chose to proceed. The CT did not show any concerning findings. We did discuss that this would not 100% rule out a stroke within the first 24 hours, however my suspicion for cerebellar or posterior circulation issues extremely low, and he also states he would not want to do an MRI anyways because he has extreme claustrophobia.  Additional evaluation is reassuring in terms of laboratory studies and EKG.  He will be treated for peripheral vertigo. He can follow with primary care doctor, and I will give him the office follow-up for ENT if he is still having symptoms after a week.     CONSULTATIONS:   None   Patient / Family / Caregiver informed of clinical  course, medical decision-making process, and agree with plan.   I discussed return precautions, follow-up instructions, and discharge instructions with patient and/or family.   ___________________________________________   FINAL CLINICAL IMPRESSION(S) / ED DIAGNOSES   Final diagnoses:  Peripheral vertigo, unspecified laterality              Note: This dictation was prepared with Dragon dictation. Any transcriptional errors that result from this process are unintentional    Lisa Roca, MD 01/10/16 1502

## 2016-01-10 NOTE — ED Triage Notes (Signed)
Pt presents to ED c/o dizziness with nausea and vomiting that started this morning at 6am. Pt states every time he lays down " everything is swirling". When he sat up this morning he was very dizzy. Pt alert and oriented x4. Denies pain

## 2016-12-16 ENCOUNTER — Other Ambulatory Visit: Payer: Self-pay | Admitting: Gastroenterology

## 2016-12-16 DIAGNOSIS — R1310 Dysphagia, unspecified: Secondary | ICD-10-CM

## 2016-12-16 DIAGNOSIS — K219 Gastro-esophageal reflux disease without esophagitis: Secondary | ICD-10-CM

## 2016-12-24 ENCOUNTER — Ambulatory Visit
Admission: RE | Admit: 2016-12-24 | Discharge: 2016-12-24 | Disposition: A | Discharge status: Home / Self Care | Payer: Medicare Other | Source: Ambulatory Visit | Attending: Gastroenterology | Admitting: Gastroenterology

## 2016-12-24 DIAGNOSIS — K219 Gastro-esophageal reflux disease without esophagitis: Secondary | ICD-10-CM | POA: Diagnosis present

## 2016-12-24 DIAGNOSIS — R1310 Dysphagia, unspecified: Secondary | ICD-10-CM

## 2016-12-24 DIAGNOSIS — K210 Gastro-esophageal reflux disease with esophagitis: Secondary | ICD-10-CM | POA: Diagnosis not present

## 2017-01-26 ENCOUNTER — Encounter: Payer: Self-pay | Admitting: *Deleted

## 2017-01-27 ENCOUNTER — Ambulatory Visit
Admission: RE | Admit: 2017-01-27 | Discharge: 2017-01-27 | Disposition: A | Discharge status: Home / Self Care | Payer: Medicare Other | Source: Ambulatory Visit | Attending: Internal Medicine | Admitting: Internal Medicine

## 2017-01-27 ENCOUNTER — Encounter
Admission: RE | Disposition: A | Discharge status: Home / Self Care | Payer: Self-pay | Source: Ambulatory Visit | Attending: Internal Medicine

## 2017-01-27 ENCOUNTER — Encounter: Payer: Self-pay | Admitting: Anesthesiology

## 2017-01-27 ENCOUNTER — Ambulatory Visit: Payer: Medicare Other | Admitting: Anesthesiology

## 2017-01-27 DIAGNOSIS — Z86010 Personal history of colonic polyps: Secondary | ICD-10-CM | POA: Insufficient documentation

## 2017-01-27 DIAGNOSIS — K269 Duodenal ulcer, unspecified as acute or chronic, without hemorrhage or perforation: Secondary | ICD-10-CM | POA: Diagnosis not present

## 2017-01-27 DIAGNOSIS — I2510 Atherosclerotic heart disease of native coronary artery without angina pectoris: Secondary | ICD-10-CM | POA: Diagnosis not present

## 2017-01-27 DIAGNOSIS — Z955 Presence of coronary angioplasty implant and graft: Secondary | ICD-10-CM | POA: Insufficient documentation

## 2017-01-27 DIAGNOSIS — Z79899 Other long term (current) drug therapy: Secondary | ICD-10-CM | POA: Diagnosis not present

## 2017-01-27 DIAGNOSIS — D123 Benign neoplasm of transverse colon: Secondary | ICD-10-CM | POA: Diagnosis not present

## 2017-01-27 DIAGNOSIS — F329 Major depressive disorder, single episode, unspecified: Secondary | ICD-10-CM | POA: Insufficient documentation

## 2017-01-27 DIAGNOSIS — Z8673 Personal history of transient ischemic attack (TIA), and cerebral infarction without residual deficits: Secondary | ICD-10-CM | POA: Insufficient documentation

## 2017-01-27 DIAGNOSIS — I10 Essential (primary) hypertension: Secondary | ICD-10-CM | POA: Insufficient documentation

## 2017-01-27 DIAGNOSIS — E785 Hyperlipidemia, unspecified: Secondary | ICD-10-CM | POA: Insufficient documentation

## 2017-01-27 DIAGNOSIS — J45909 Unspecified asthma, uncomplicated: Secondary | ICD-10-CM | POA: Insufficient documentation

## 2017-01-27 DIAGNOSIS — K219 Gastro-esophageal reflux disease without esophagitis: Secondary | ICD-10-CM | POA: Diagnosis not present

## 2017-01-27 DIAGNOSIS — K2270 Barrett's esophagus without dysplasia: Secondary | ICD-10-CM | POA: Insufficient documentation

## 2017-01-27 DIAGNOSIS — Z1211 Encounter for screening for malignant neoplasm of colon: Secondary | ICD-10-CM | POA: Insufficient documentation

## 2017-01-27 DIAGNOSIS — D124 Benign neoplasm of descending colon: Secondary | ICD-10-CM | POA: Insufficient documentation

## 2017-01-27 DIAGNOSIS — K640 First degree hemorrhoids: Secondary | ICD-10-CM | POA: Diagnosis not present

## 2017-01-27 DIAGNOSIS — R51 Headache: Secondary | ICD-10-CM | POA: Insufficient documentation

## 2017-01-27 DIAGNOSIS — Z7982 Long term (current) use of aspirin: Secondary | ICD-10-CM | POA: Diagnosis not present

## 2017-01-27 DIAGNOSIS — Z7902 Long term (current) use of antithrombotics/antiplatelets: Secondary | ICD-10-CM | POA: Insufficient documentation

## 2017-01-27 HISTORY — DX: Hyperlipidemia, unspecified: E78.5

## 2017-01-27 HISTORY — DX: Hallux valgus (acquired), unspecified foot: M20.10

## 2017-01-27 HISTORY — DX: Depression, unspecified: F32.A

## 2017-01-27 HISTORY — PX: ESOPHAGOGASTRODUODENOSCOPY (EGD) WITH PROPOFOL: SHX5813

## 2017-01-27 HISTORY — DX: Headache: R51

## 2017-01-27 HISTORY — DX: Headache, unspecified: R51.9

## 2017-01-27 HISTORY — DX: Transient cerebral ischemic attack, unspecified: G45.9

## 2017-01-27 HISTORY — DX: Major depressive disorder, single episode, unspecified: F32.9

## 2017-01-27 HISTORY — DX: Atherosclerotic heart disease of native coronary artery without angina pectoris: I25.10

## 2017-01-27 HISTORY — PX: COLONOSCOPY WITH PROPOFOL: SHX5780

## 2017-01-27 HISTORY — DX: Gastro-esophageal reflux disease without esophagitis: K21.9

## 2017-01-27 SURGERY — ESOPHAGOGASTRODUODENOSCOPY (EGD) WITH PROPOFOL
Anesthesia: General

## 2017-01-27 MED ORDER — PHENYLEPHRINE HCL 10 MG/ML IJ SOLN
INTRAMUSCULAR | Status: DC | PRN
  Administered 2017-01-27: 100 ug via INTRAVENOUS

## 2017-01-27 MED ORDER — FENTANYL CITRATE (PF) 100 MCG/2ML IJ SOLN
INTRAMUSCULAR | Status: AC
  Filled 2017-01-27: qty 2

## 2017-01-27 MED ORDER — FENTANYL CITRATE (PF) 100 MCG/2ML IJ SOLN
INTRAMUSCULAR | Status: DC | PRN
  Administered 2017-01-27 (×2): 50 ug via INTRAVENOUS

## 2017-01-27 MED ORDER — PROPOFOL 10 MG/ML IV BOLUS
INTRAVENOUS | Status: AC
Start: 2017-01-27 — End: ?
  Filled 2017-01-27: qty 20

## 2017-01-27 MED ORDER — LIDOCAINE 2% (20 MG/ML) 5 ML SYRINGE
INTRAMUSCULAR | Status: DC | PRN
  Administered 2017-01-27: 40 mg via INTRAVENOUS

## 2017-01-27 MED ORDER — PROPOFOL 500 MG/50ML IV EMUL
INTRAVENOUS | Status: AC
  Filled 2017-01-27: qty 50

## 2017-01-27 MED ORDER — PROPOFOL 500 MG/50ML IV EMUL
INTRAVENOUS | Status: DC | PRN
  Administered 2017-01-27: 140 ug/kg/min via INTRAVENOUS

## 2017-01-27 MED ORDER — EPHEDRINE SULFATE 50 MG/ML IJ SOLN
INTRAMUSCULAR | Status: DC | PRN
  Administered 2017-01-27 (×2): 5 mg via INTRAVENOUS

## 2017-01-27 MED ORDER — SODIUM CHLORIDE 0.9 % IV SOLN
INTRAVENOUS | Status: DC
  Administered 2017-01-27 (×2): via INTRAVENOUS

## 2017-01-27 MED ORDER — PROPOFOL 10 MG/ML IV BOLUS
INTRAVENOUS | Status: DC | PRN
  Administered 2017-01-27: 100 mg via INTRAVENOUS

## 2017-01-27 MED ORDER — LABETALOL HCL 5 MG/ML IV SOLN
INTRAVENOUS | Status: AC
  Filled 2017-01-27: qty 4

## 2017-01-27 NOTE — Interval H&P Note (Signed)
History and Physical Interval Note:  01/27/2017 10:54 AM  Paul Marquez  has presented today for surgery, with the diagnosis of HX COLON POLYPS GERD  The various methods of treatment have been discussed with the patient and family. After consideration of risks, benefits and other options for treatment, the patient has consented to  Procedure(s): ESOPHAGOGASTRODUODENOSCOPY (EGD) WITH PROPOFOL (N/A) COLONOSCOPY WITH PROPOFOL (N/A) as a surgical intervention .  The patient's history has been reviewed, patient examined, no change in status, stable for surgery.  I have reviewed the patient's chart and labs.  Questions were answered to the patient's satisfaction.     [place], [place]

## 2017-01-27 NOTE — Transfer of Care (Signed)
Immediate Anesthesia Transfer of Care Note  Patient: Paul Marquez  Procedure(s) Performed: ESOPHAGOGASTRODUODENOSCOPY (EGD) WITH PROPOFOL (N/A ) COLONOSCOPY WITH PROPOFOL (N/A )  Patient Location: PACU and Endoscopy Unit  Anesthesia Type:General  Level of Consciousness: sedated  Airway & Oxygen Therapy: Patient Spontanous Breathing and Patient connected to nasal cannula oxygen  Post-op Assessment: Report given to RN and Post -op Vital signs reviewed and stable  Post vital signs: Reviewed and stable  Last Vitals:  Vitals:   01/27/17 0834  BP: 119/73  Pulse: 66  Resp: 20  Temp: (!) 36 C  SpO2: 97%    Last Pain:  Vitals:   01/27/17 0834  TempSrc: Tympanic         Complications: No apparent anesthesia complications

## 2017-01-27 NOTE — Op Note (Signed)
[facility] Gastroenterology Patient Name: Paul Marquez Procedure Date: 01/27/2017 10:54 AM MRN: [id number] Account #: [id number] Date of Birth: 03-Feb-1941 Admit Type: Outpatient Age: 76 Room: [facility] ENDO ROOM 3 Gender: Male Note Status: Finalized Procedure:            Colonoscopy Indications:          High risk colon cancer surveillance: Personal history                        of colonic polyps Providers:            Benay Pike. Alice Reichert MD, MD Referring MD:         Irven Easterly. Kary Kos, MD (Referring MD) Medicines:            Propofol per Anesthesia Complications:        No immediate complications. Procedure:            Pre-Anesthesia Assessment:                       - The risks and benefits of the procedure and the                        sedation options and risks were discussed with the                        patient. All questions were answered and informed                        consent was obtained.                       - Patient identification and proposed procedure were                        verified prior to the procedure by the nurse. The                        procedure was verified in the procedure room.                       - ASA Grade Assessment: III - A patient with severe                        systemic disease.                       - After reviewing the risks and benefits, the patient                        was deemed in satisfactory condition to undergo the                        procedure.                       After obtaining informed consent, the colonoscope was                        passed under direct vision. Throughout the procedure,  the patient's blood pressure, pulse, and oxygen                        saturations were monitored continuously. The                        Colonoscope was introduced through the anus and                        advanced to the the cecum, identified by appendiceal                         orifice and ileocecal valve. The colonoscopy was                        performed without difficulty. The patient tolerated the                        procedure well. The quality of the bowel preparation                        was adequate to identify polyps. The ileocecal valve,                        appendiceal orifice, and rectum were photographed. Findings:      The perianal and digital rectal examinations were normal. Pertinent       negatives include normal sphincter tone and no palpable rectal lesions.      A 3 mm polyp was found in the transverse colon. The polyp was sessile.       The polyp was removed with a jumbo cold forceps. Resection and retrieval       were complete.      A 6 mm polyp was found in the transverse colon. The polyp was sessile.       The polyp was removed with a hot snare. Resection and retrieval were       complete.      Four sessile polyps were found in the descending colon. The polyps were       2 to 3 mm in size. These polyps were removed with a jumbo cold forceps.       Resection and retrieval were complete.      Non-bleeding internal hemorrhoids were found during retroflexion. The       hemorrhoids were small and Grade I (internal hemorrhoids that do not       prolapse). Impression:           - One 3 mm polyp in the transverse colon, removed with                        a jumbo cold forceps. Resected and retrieved.                       - One 6 mm polyp in the transverse colon, removed with                        a hot snare. Resected and retrieved.                       - Four 2 to 3 mm polyps in the descending  colon,                        removed with a jumbo cold forceps. Resected and                        retrieved.                       - Non-bleeding internal hemorrhoids. Recommendation:       - Repeat colonoscopy for surveillance based on                        pathology results.                       - Return to GI office PRN.                        - Patient has a contact number available for                        emergencies. The signs and symptoms of potential                        delayed complications were discussed with the patient.                        Return to normal activities tomorrow. Written discharge                        instructions were provided to the patient.                       - Resume previous diet.                       - Continue present medications.                       - Resume Plavix (clopidogrel) at prior dose tomorrow.                        Refer to managing physician for further adjustment of                        therapy.                       - Telephone GI clinic for pathology results in 1 week.                       - The findings and recommendations were discussed with                        the patient.                       - The findings and recommendations were discussed with                        the patient's family. Procedure Code(s):    --- Professional ---  45385, Colonoscopy, flexible; with removal of tumor(s),                        polyp(s), or other lesion(s) by snare technique                       45380, 59, Colonoscopy, flexible; with biopsy, single                        or multiple Diagnosis Code(s):    --- Professional ---                       Z86.010, Personal history of colonic polyps                       D12.3, Benign neoplasm of transverse colon (hepatic                        flexure or splenic flexure)                       D12.4, Benign neoplasm of descending colon                       K64.0, First degree hemorrhoids CPT copyright 2016 American Medical Association. All rights reserved. The codes documented in this report are preliminary and upon coder review may  be revised to meet current compliance requirements. Efrain Sella MD, MD 01/27/2017 11:42:58 AM This report has been signed electronically. Number of Addenda:  0 Note Initiated On: 01/27/2017 10:54 AM Scope Withdrawal Time: 0 hours 9 minutes 58 seconds  Total Procedure Duration: 0 hours 20 minutes 50 seconds       [facility]

## 2017-01-27 NOTE — Anesthesia Postprocedure Evaluation (Signed)
Anesthesia Post Note  Patient: Paul Marquez  Procedure(s) Performed: ESOPHAGOGASTRODUODENOSCOPY (EGD) WITH PROPOFOL (N/A ) COLONOSCOPY WITH PROPOFOL (N/A )  Patient location during evaluation: Endoscopy Anesthesia Type: General Level of consciousness: awake and alert Pain management: pain level controlled Vital Signs Assessment: post-procedure vital signs reviewed and stable Respiratory status: spontaneous breathing, nonlabored ventilation, respiratory function stable and patient connected to nasal cannula oxygen Cardiovascular status: blood pressure returned to baseline and stable Postop Assessment: no apparent nausea or vomiting Anesthetic complications: no     Last Vitals:  Vitals:   01/27/17 1213 01/27/17 1223  BP: 127/63 122/62  Pulse: (!) 51 (!) 55  Resp: 18 17  Temp:    SpO2: 96% 99%    Last Pain:  Vitals:   01/27/17 1143  TempSrc: Tympanic  PainSc: Asleep                 Rozalyn Osland S

## 2017-01-27 NOTE — Op Note (Signed)
[facility] Gastroenterology Patient Name: Sherrill Mckamie Procedure Date: 01/27/2017 10:55 AM MRN: [id number] Account #: [id number] Date of Birth: 1940-10-09 Admit Type: Outpatient Age: 76 Room: [facility] ENDO ROOM 3 Gender: Male Note Status: Finalized Procedure:            Upper GI endoscopy Indications:          Surveillance for malignancy due to personal history of                        Barrett's esophagus, Dysphagia Providers:            Benay Pike. Alice Reichert MD, MD Referring MD:         Irven Easterly. Kary Kos, MD (Referring MD) Medicines:            Propofol per Anesthesia Complications:        No immediate complications. Procedure:            Pre-Anesthesia Assessment:                       - The risks and benefits of the procedure and the                        sedation options and risks were discussed with the                        patient. All questions were answered and informed                        consent was obtained.                       - Patient identification and proposed procedure were                        verified prior to the procedure by the nurse. The                        procedure was verified in the procedure room.                       - ASA Grade Assessment: III - A patient with severe                        systemic disease.                       - ASA Grade Assessment: III - A patient with severe                        systemic disease.                       After obtaining informed consent, the endoscope was                        passed under direct vision. Throughout the procedure,                        the patient's blood pressure, pulse, and oxygen  saturations were monitored continuously. The Endoscope                        was introduced through the mouth, and advanced to the                        third part of duodenum. The upper GI endoscopy was                        accomplished without difficulty. The  patient tolerated                        the procedure well. Findings:      The esophagus and gastroesophageal junction were examined with white       light. There were esophageal mucosal changes consistent with       long-segment Barrett's esophagus. These changes involved the mucosa at       the upper extent of the gastric folds (38 cm from the incisors)       extending to the Z-line (35 cm from the incisors). Salmon-colored mucosa       was present. The maximum longitudinal extent of these esophageal mucosal       changes was 3 cm in length. Mucosa was biopsied with a cold forceps for       histology in 4 quadrants at intervals of 2.5 cm in the lower third of       the esophagus. One specimen bottle was sent to pathology.      The exam was otherwise without abnormality.      The entire examined stomach was normal. Biopsies were taken with a cold       forceps for Helicobacter pylori testing.      A few localized erosions without bleeding were found in the first       portion of the duodenum. Impression:           - Esophageal mucosal changes consistent with                        long-segment Barrett's esophagus. Biopsied.                       - The examination was otherwise normal.                       - Normal stomach. Biopsied.                       - Duodenal erosions without bleeding. Recommendation:       - Await pathology results.                       - Repeat upper endoscopy in 3 years for surveillance                        based on pathology results.                       - See the other procedure note for documentation of                        additional recommendations. Procedure Code(s):    --- Professional ---  91505, Esophagogastroduodenoscopy, flexible, transoral;                        with biopsy, single or multiple Diagnosis Code(s):    --- Professional ---                       K22.70, Barrett's esophagus without dysplasia                        K26.9, Duodenal ulcer, unspecified as acute or chronic,                        without hemorrhage or perforation                       R13.10, Dysphagia, unspecified CPT copyright 2016 American Medical Association. All rights reserved. The codes documented in this report are preliminary and upon coder review may  be revised to meet current compliance requirements. Efrain Sella MD, MD 01/27/2017 11:13:43 AM This report has been signed electronically. Number of Addenda: 0 Note Initiated On: 01/27/2017 10:55 AM      [facility]

## 2017-01-27 NOTE — Anesthesia Preprocedure Evaluation (Addendum)
Anesthesia Evaluation  Patient identified by MRN, date of birth, ID band Patient awake    Reviewed: Allergy & Precautions, NPO status , Patient's Chart, lab work & pertinent test results, reviewed documented beta blocker date and time   Airway Mallampati: II  TM Distance: >3 FB     Dental  (+) Upper Dentures, Lower Dentures   Pulmonary asthma , former smoker,           Cardiovascular hypertension, Pt. on medications + CAD       Neuro/Psych  Headaches, PSYCHIATRIC DISORDERS Depression TIA   GI/Hepatic GERD  ,  Endo/Other    Renal/GU      Musculoskeletal   Abdominal   Peds  Hematology   Anesthesia Other Findings   Reproductive/Obstetrics                            Anesthesia Physical Anesthesia Plan  ASA: III  Anesthesia Plan: General   Post-op Pain Management:    Induction: Intravenous  PONV Risk Score and Plan:   Airway Management Planned:   Additional Equipment:   Intra-op Plan:   Post-operative Plan:   Informed Consent: I have reviewed the patients History and Physical, chart, labs and discussed the procedure including the risks, benefits and alternatives for the proposed anesthesia with the patient or authorized representative who has indicated his/her understanding and acceptance.     Plan Discussed with: CRNA  Anesthesia Plan Comments:         Anesthesia Quick Evaluation

## 2017-01-27 NOTE — Anesthesia Post-op Follow-up Note (Signed)
Anesthesia QCDR form completed.        

## 2017-01-27 NOTE — H&P (Signed)
Outpatient short stay form Pre-procedure 01/27/2017 10:48 AM Paul Marquez, M.D.  Primary Physician: Maryland Pink, M.D.  Reason for visit:  Dysphagia, personal hx of colon polyps, ?Barrett's esophagus.   History of present illness:  Paul Marquez is a pleasant 76 y/o male presenting today for EGD and colonoscopy. Patient has several yrs of intermittent dysphagia, recently attributed to a post-neck surgery osteophyte. He feels that overall he eats well, however. Has possible, but not definitive biopsies in 2014 EGD to confirm Barrett's. Colonoscopy in 2014 by Dr. Gustavo Lah revealed a rectal tubular adenoma. Patient has had some intermittent diarrhea, but that is resolved now. He takes Plavix for hx of coronary stent but has held the medication for the past 5 days.     Current Facility-Administered Medications:  .  0.9 %  sodium chloride infusion, , Intravenous, Continuous, [place], Benay Pike, MD, Last Rate: 20 mL/hr at 01/27/17 5009  Prescriptions Prior to Admission  Medication Sig Dispense Refill Last Dose  . amLODipine (NORVASC) 10 MG tablet Take 1 tablet by mouth daily.   01/26/2017 at Unknown time  . aspirin EC 81 MG tablet Take 1 tablet by mouth daily.   Past Week at Unknown time  . cyanocobalamin 1000 MCG tablet Take 1,000 mcg by mouth daily.   Past Week at Unknown time  . EQL NATURAL ZINC 50 MG TABS Take 1 tablet by mouth daily.   Past Week at Unknown time  . omeprazole (PRILOSEC) 20 MG capsule Take 1 capsule by mouth daily.   Past Week at Unknown time  . oxybutynin (DITROPAN) 5 MG tablet Take 1 tablet by mouth 3 (three) times daily.   Past Week at Unknown time  . Potassium Gluconate ([employer] POTASSIUM GLUCONATE) 2 MEQ TABS Take 1 tablet by mouth daily.   Past Week at Unknown time  . simvastatin (ZOCOR) 20 MG tablet Take 1 tablet by mouth daily.   Past Week at Unknown time  . Venlafaxine HCl 150 MG TB24 Take 1 tablet by mouth daily.   01/26/2017 at Unknown time  . clopidogrel (PLAVIX) 75  MG tablet Take 1 tablet by mouth daily.   01/22/2017  . meclizine (ANTIVERT) 25 MG tablet Take 1 tablet (25 mg total) by mouth 3 (three) times daily as needed for dizziness or nausea. 20 tablet 0   . ondansetron (ZOFRAN) 4 MG tablet Take 1 tablet (4 mg total) by mouth every 8 (eight) hours as needed for nausea or vomiting. 10 tablet 0      Allergies  Allergen Reactions  . Other     ansyn  . Zoloft [Sertraline]      Past Medical History:  Diagnosis Date  . Asthma   . Coronary artery disease   . Depression   . GERD (gastroesophageal reflux disease)   . Hallux valgus, acquired   . Headache   . Hyperlipidemia   . Hypertension   . TIA (transient ischemic attack)     Review of systems:      Physical Exam  General appearance: alert, cooperative and appears stated age Resp: clear to auscultation bilaterally Cardio: regular rate and rhythm, S1, S2 normal, no murmur, click, rub or gallop GI: soft, non-tender; bowel sounds normal; no masses,  no organomegaly     Planned procedures: EGD and colonoscopy. The patient understands the nature of the planned procedure, indications, risks, alternatives and potential complications including but not limited to bleeding, infection, perforation, damage to internal organs and possible oversedation/side effects from anesthesia. The patient  agrees and gives consent to proceed.  Please refer to procedure notes for findings, recommendations and patient disposition/instructions.    Paul Marquez, M.D. Gastroenterology 01/27/2017  10:48 AM

## 2017-01-28 LAB — SURGICAL PATHOLOGY

## 2017-01-29 ENCOUNTER — Encounter: Payer: Self-pay | Admitting: Internal Medicine

## 2017-05-17 ENCOUNTER — Telehealth: Payer: Self-pay | Admitting: Urology

## 2017-05-17 NOTE — Telephone Encounter (Signed)
Pt is out of Oxybutnin and needs a refill.  He scheduled an appt w/[facility] for 3/5.  He uses [facility] on [address]  Please give pt a call and let him know this has been refilled.  (H) [phone] (C) [phone]

## 2017-05-19 MED ORDER — OXYBUTYNIN CHLORIDE 5 MG PO TABS: 5.0000 mg | ORAL_TABLET | Freq: Three times a day (TID) | ORAL | 0 refills | Status: DC

## 2017-05-19 NOTE — Telephone Encounter (Signed)
rx sent

## 2017-06-08 ENCOUNTER — Ambulatory Visit: Payer: Medicare HMO | Admitting: Urology

## 2017-06-08 ENCOUNTER — Encounter: Payer: Self-pay | Admitting: Urology

## 2017-06-08 VITALS — BP 126/70 | HR 61 | Ht 70.0 in | Wt 192.8 lb

## 2017-06-08 DIAGNOSIS — N400 Enlarged prostate without lower urinary tract symptoms: Secondary | ICD-10-CM | POA: Diagnosis not present

## 2017-06-08 LAB — URINALYSIS, COMPLETE
BILIRUBIN UA: NEGATIVE
GLUCOSE, UA: NEGATIVE
Ketones, UA: NEGATIVE
Leukocytes, UA: NEGATIVE
Nitrite, UA: NEGATIVE
PROTEIN UA: NEGATIVE
Specific Gravity, UA: 1.02 (ref 1.005–1.030)
Urobilinogen, Ur: 0.2 mg/dL (ref 0.2–1.0)
pH, UA: 5 (ref 5.0–7.5)

## 2017-06-08 LAB — BLADDER SCAN AMB NON-IMAGING: Scan Result: 0

## 2017-06-08 MED ORDER — MIRABEGRON ER 50 MG PO TB24: 50.0000 mg | ORAL_TABLET | Freq: Every day | ORAL | 5 refills | Status: DC

## 2017-06-08 MED ORDER — OXYBUTYNIN CHLORIDE 5 MG PO TABS: 5.0000 mg | ORAL_TABLET | Freq: Two times a day (BID) | ORAL | 0 refills | Status: DC

## 2017-06-08 NOTE — Progress Notes (Signed)
06/08/2017 9:20 AM   Paul Marquez Feb 28, 1941 [id number]  Referring provider: Maryland Pink, MD [address] [facility] [place], [place] [address]  Chief Complaint  Patient presents with  . Benign Prostatic Hypertrophy    HPI: 77 year old male presents for follow-up of BPH.  He has a long history of BPH with lower urinary tract symptoms.  I last saw him at [facility] on 11/02/2016.  He is status post TURP in September 2014.  He never discontinued tamsulosin and desired to continue.  He was also taking immediate release oxybutynin as needed for frequency and urgency.  Since his visit last year he has noted increased urinary urgency which occurs when standing.  He denies urge incontinence.  He is now taking oxybutynin twice daily.  He remains on tamsulosin.  Denies dysuria or gross hematuria.  Denies flank, abdominal, pelvic or scrotal pain.   PMH: Past Medical History:  Diagnosis Date  . Asthma   . Coronary artery disease   . Depression   . GERD (gastroesophageal reflux disease)   . Hallux valgus, acquired   . Headache   . Hyperlipidemia   . Hypertension   . TIA (transient ischemic attack)     Surgical History: Past Surgical History:  Procedure Laterality Date  . COLONOSCOPY    . COLONOSCOPY WITH PROPOFOL N/A 01/27/2017   Procedure: COLONOSCOPY WITH PROPOFOL;  Surgeon: Toledo, Benay Pike, MD;  Location: [facility] ENDOSCOPY;  Service: Gastroenterology;  Laterality: N/A;  . CORONARY ANGIOPLASTY    . CORONARY STENT PLACEMENT    . ESOPHAGOGASTRODUODENOSCOPY (EGD) WITH PROPOFOL N/A 01/27/2017   Procedure: ESOPHAGOGASTRODUODENOSCOPY (EGD) WITH PROPOFOL;  Surgeon: Toledo, Benay Pike, MD;  Location: [facility] ENDOSCOPY;  Service: Gastroenterology;  Laterality: N/A;  . EYE SURGERY    . HERNIA REPAIR    . PROSTATE SURGERY    . SHOULDER SURGERY    . tdap    . UPPER GASTROINTESTINAL ENDOSCOPY      Home Medications:  Allergies as of 06/08/2017      Reactions   Aspirin-caffeine    Other  reaction(s): Unknown   Other    ansyn   Zoloft [sertraline]       Medication List        Accurate as of 06/08/17  9:20 AM. Always use your most recent med list.          amLODipine 10 MG tablet Commonly known as:  NORVASC Take 1 tablet by mouth daily.   aspirin EC 81 MG tablet Take 1 tablet by mouth daily.   clopidogrel 75 MG tablet Commonly known as:  PLAVIX Take 1 tablet by mouth daily.   [employer]S POTASSIUM GLUCONATE 2 MEQ Tabs Generic drug:  Potassium Gluconate Take 1 tablet by mouth daily.   cyanocobalamin 1000 MCG tablet Take 1,000 mcg by mouth daily.   EQL NATURAL ZINC 50 MG Tabs Take 1 tablet by mouth daily.   meclizine 25 MG tablet Commonly known as:  ANTIVERT Take 1 tablet (25 mg total) by mouth 3 (three) times daily as needed for dizziness or nausea.   omeprazole 20 MG capsule Commonly known as:  PRILOSEC Take 1 capsule by mouth daily.   oxybutynin 5 MG tablet Commonly known as:  DITROPAN Take 1 tablet (5 mg total) by mouth 2 (two) times daily.   simvastatin 20 MG tablet Commonly known as:  ZOCOR Take 1 tablet by mouth daily.   Venlafaxine HCl 150 MG Tb24 Take 1 tablet by mouth daily.  Allergies:  Allergies  Allergen Reactions  . Aspirin-Caffeine     Other reaction(s): Unknown  . Other     ansyn  . Zoloft [Sertraline]     Family History: History reviewed. No pertinent family history.  Social History:  reports that he quit smoking about 33 years ago. he has never used smokeless tobacco. He reports that he does not drink alcohol or use drugs.  ROS: UROLOGY Frequent Urination?: Yes Hard to postpone urination?: No Burning/pain with urination?: No Get up at night to urinate?: Yes Leakage of urine?: No Urine stream starts and stops?: No Trouble starting stream?: No Do you have to strain to urinate?: No Blood in urine?: No Urinary tract infection?: No Sexually transmitted disease?: No Injury to kidneys or bladder?: No Painful  intercourse?: No Weak stream?: No Erection problems?: No Penile pain?: No  Gastrointestinal Nausea?: No Vomiting?: No Indigestion/heartburn?: No Diarrhea?: No Constipation?: No  Constitutional Fever: No Night sweats?: Yes Weight loss?: No Fatigue?: No  Skin Skin rash/lesions?: No Itching?: Yes  Eyes Blurred vision?: No Double vision?: No  Ears/Nose/Throat Sore throat?: No Sinus problems?: No  Hematologic/Lymphatic Swollen glands?: No Easy bruising?: Yes  Cardiovascular Leg swelling?: No Chest pain?: No  Respiratory Cough?: Yes Shortness of breath?: No  Endocrine Excessive thirst?: No  Musculoskeletal Back pain?: No Joint pain?: Yes  Neurological Headaches?: No Dizziness?: No  Psychologic Depression?: No Anxiety?: Yes  Physical Exam: BP 126/70 (BP Location: Right Arm, Patient Position: Sitting, Cuff Size: Normal)   Pulse 61   Ht 5\' 10"  (1.778 m)   Wt 192 lb 12.8 oz (87.5 kg)   BMI 27.66 kg/m    Constitutional:  Alert and oriented, No acute distress. HEENT: [place] AT, moist mucus membranes.  Trachea midline, no masses. Cardiovascular: No clubbing, cyanosis, or edema. Respiratory: Normal respiratory effort, no increased work of breathing. GI: Abdomen is soft, nontender, nondistended, no abdominal masses GU: No CVA tenderness. Skin: No rashes, bruises or suspicious lesions. Lymph: No cervical or inguinal adenopathy. Neurologic: Grossly intact, no focal deficits, moving all 4 extremities. Psychiatric: Normal mood and affect.  Laboratory Data: Lab Results  Component Value Date   WBC 5.8 01/10/2016   HGB 12.0 (L) 01/10/2016   HCT 34.2 (L) 01/10/2016   MCV 93.0 01/10/2016   PLT 100 (L) 01/10/2016    Lab Results  Component Value Date   CREATININE 0.95 01/10/2016    Urinalysis Dipstick-trace intact blood Microscopy-negative   Assessment & Plan:    1. Benign prostatic hyperplasia, unspecified whether lower urinary tract symptoms  present Over the last year he has noted worsening storage related symptoms and immediate release oxybutynin is not as effective.  PVR by bladder scan today was 0 mL.  Recommend continuing tamsulosin.  He was given samples of Myrbetriq 50 mg daily and Rx was sent to his pharmacy.  If this is going to be cost prohibitive will switch to an extended release anticholinergic.  Follow-up annually or as needed for any significant change in his symptoms.  - Urinalysis, Complete - Bladder Scan (Post Void Residual) in office    Abbie Sons, MD  [facility] [address], [place], [place] [address] [phone]

## 2017-07-19 ENCOUNTER — Other Ambulatory Visit: Payer: Self-pay

## 2017-07-19 MED ORDER — OXYBUTYNIN CHLORIDE 5 MG PO TABS: 5.0000 mg | ORAL_TABLET | Freq: Two times a day (BID) | ORAL | 3 refills | Status: DC

## 2017-10-21 ENCOUNTER — Other Ambulatory Visit: Payer: Self-pay | Admitting: Family Medicine

## 2017-10-21 DIAGNOSIS — R109 Unspecified abdominal pain: Secondary | ICD-10-CM

## 2017-10-27 ENCOUNTER — Ambulatory Visit
Admission: RE | Admit: 2017-10-27 | Discharge: 2017-10-27 | Disposition: A | Discharge status: Home / Self Care | Payer: Medicare HMO | Source: Ambulatory Visit | Attending: Family Medicine | Admitting: Family Medicine

## 2017-10-27 ENCOUNTER — Other Ambulatory Visit: Payer: Self-pay | Admitting: Family Medicine

## 2017-10-27 DIAGNOSIS — K429 Umbilical hernia without obstruction or gangrene: Secondary | ICD-10-CM | POA: Diagnosis present

## 2017-10-27 DIAGNOSIS — R109 Unspecified abdominal pain: Secondary | ICD-10-CM | POA: Insufficient documentation

## 2018-01-18 ENCOUNTER — Other Ambulatory Visit: Payer: Self-pay | Admitting: Family Medicine

## 2018-01-18 MED ORDER — OXYBUTYNIN CHLORIDE 5 MG PO TABS: 5.0000 mg | ORAL_TABLET | Freq: Two times a day (BID) | ORAL | 3 refills | Status: DC

## 2018-03-28 ENCOUNTER — Ambulatory Visit
Admission: EM | Admit: 2018-03-28 | Discharge: 2018-03-28 | Disposition: A | Discharge status: Home / Self Care | Payer: Non-veteran care | Attending: Family Medicine | Admitting: Family Medicine

## 2018-03-28 ENCOUNTER — Encounter: Payer: Self-pay | Admitting: Emergency Medicine

## 2018-03-28 ENCOUNTER — Other Ambulatory Visit: Payer: Self-pay

## 2018-03-28 DIAGNOSIS — Z87891 Personal history of nicotine dependence: Secondary | ICD-10-CM | POA: Diagnosis not present

## 2018-03-28 DIAGNOSIS — J069 Acute upper respiratory infection, unspecified: Secondary | ICD-10-CM | POA: Insufficient documentation

## 2018-03-28 DIAGNOSIS — B9789 Other viral agents as the cause of diseases classified elsewhere: Secondary | ICD-10-CM | POA: Diagnosis not present

## 2018-03-28 MED ORDER — HYDROCOD POLST-CPM POLST ER 10-8 MG/5ML PO SUER: 5.0000 mL | Freq: Every evening | ORAL | 0 refills | Status: DC | PRN

## 2018-03-28 NOTE — ED Provider Notes (Signed)
[facility]    CSN: [id number] Arrival date & time: 03/28/18  0909     History   Chief Complaint Chief Complaint  Patient presents with  . URI    HPI Paul Marquez is a 77 y.o. male.   The history is provided by the patient.  URI  Presenting symptoms: congestion, cough and rhinorrhea   Severity:  Moderate Onset quality:  Sudden Duration:  4 days Timing:  Constant Progression:  Unchanged Chronicity:  New Relieved by:  OTC medications Associated symptoms: no wheezing   Risk factors: being elderly, chronic cardiac disease, chronic respiratory disease and sick contacts     Past Medical History:  Diagnosis Date  . Asthma   . Coronary artery disease   . Depression   . GERD (gastroesophageal reflux disease)   . Hallux valgus, acquired   . Headache   . Hyperlipidemia   . Hypertension   . TIA (transient ischemic attack)     Patient Active Problem List   Diagnosis Date Noted  . Benign prostatic hyperplasia 06/08/2017    Past Surgical History:  Procedure Laterality Date  . COLONOSCOPY    . COLONOSCOPY WITH PROPOFOL N/A 01/27/2017   Procedure: COLONOSCOPY WITH PROPOFOL;  Surgeon: Toledo, Benay Pike, MD;  Location: [facility];  Service: Gastroenterology;  Laterality: N/A;  . CORONARY ANGIOPLASTY    . CORONARY STENT PLACEMENT    . ESOPHAGOGASTRODUODENOSCOPY (EGD) WITH PROPOFOL N/A 01/27/2017   Procedure: ESOPHAGOGASTRODUODENOSCOPY (EGD) WITH PROPOFOL;  Surgeon: Toledo, Benay Pike, MD;  Location: [facility]OSCOPY;  Service: Gastroenterology;  Laterality: N/A;  . EYE SURGERY    . HERNIA REPAIR    . PROSTATE SURGERY    . SHOULDER SURGERY    . tdap    . UPPER GASTROINTESTINAL ENDOSCOPY         Home Medications    Prior to Admission medications   Medication Sig Start Date End Date Taking? Authorizing Provider  amLODipine (NORVASC) 10 MG tablet Take 1 tablet by mouth daily. 12/14/15  Yes [provider]  aspirin EC 81 MG tablet Take 1 tablet  by mouth daily.   Yes [provider]  Cholecalciferol (VITAMIN D3) 100000 UNIT/GM POWD by Does not apply route.   Yes [provider]  clopidogrel (PLAVIX) 75 MG tablet Take 1 tablet by mouth daily. 07/10/10  Yes [provider]  cyanocobalamin 1000 MCG tablet Take 1,000 mcg by mouth daily.   Yes [provider]  EQL NATURAL ZINC 50 MG TABS Take 1 tablet by mouth daily.   Yes [provider]  omeprazole (PRILOSEC) 20 MG capsule Take 1 capsule by mouth daily. 10/24/15  Yes [provider]  oxybutynin (DITROPAN) 5 MG tablet Take 1 tablet (5 mg total) by mouth 2 (two) times daily. 01/18/18  Yes Stoioff, Ronda Fairly, MD  Potassium Gluconate ([employer]S POTASSIUM GLUCONATE) 2 MEQ TABS Take 1 tablet by mouth daily.   Yes [provider]  simvastatin (ZOCOR) 20 MG tablet Take 1 tablet by mouth daily. 10/14/15  Yes [provider]  tamsulosin (FLOMAX) 0.4 MG CAPS capsule Take 0.4 mg by mouth.   Yes [provider]  Venlafaxine HCl 150 MG TB24 Take 1 tablet by mouth daily.   Yes [provider]  chlorpheniramine-HYDROcodone (TUSSIONEX PENNKINETIC ER) 10-8 MG/5ML SUER Take 5 mLs by mouth at bedtime as needed. 03/28/18   Norval Gable, MD  meclizine (ANTIVERT) 25 MG tablet Take 1 tablet (25 mg total) by mouth 3 (three) times  daily as needed for dizziness or nausea. 01/10/16   Lisa Roca, MD  mirabegron ER (MYRBETRIQ) 50 MG TB24 tablet Take 1 tablet (50 mg total) by mouth daily. 06/08/17   Abbie Sons, MD    Family History Family History  Problem Relation Age of Onset  . Stroke Father     Social History Social History   Tobacco Use  . Smoking status: Former Smoker    Last attempt to quit: 04/06/1984    Years since quitting: 33.9  . Smokeless tobacco: Never Used  Substance Use Topics  . Alcohol use: No  . Drug use: No     Allergies   Aspirin-caffeine; Other; and Zoloft [sertraline]   Review of Systems Review  of Systems  HENT: Positive for congestion and rhinorrhea.   Respiratory: Positive for cough. Negative for wheezing.      Physical Exam Triage Vital Signs ED Triage Vitals  Enc Vitals Group     BP 03/28/18 0932 (!) 159/77     Pulse Rate 03/28/18 0932 84     Resp 03/28/18 0932 18     Temp 03/28/18 0932 98.2 F (36.8 C)     Temp Source 03/28/18 0932 Oral     SpO2 03/28/18 0932 97 %     Weight 03/28/18 0926 186 lb (84.4 kg)     Height 03/28/18 0926 5\' 10"  (1.778 m)     Head Circumference --      Peak Flow --      Pain Score 03/28/18 0926 5     Pain Loc --      Pain Edu? --      Excl. in [place]? --    No data found.  Updated Vital Signs BP (!) 159/77 (BP Location: Left Arm)   Pulse 84   Temp 98.2 F (36.8 C) (Oral)   Resp 18   Ht 5\' 10"  (1.778 m)   Wt 84.4 kg   SpO2 97%   BMI 26.69 kg/m   Visual Acuity Right Eye Distance:   Left Eye Distance:   Bilateral Distance:    Right Eye Near:   Left Eye Near:    Bilateral Near:     Physical Exam Vitals signs and nursing note reviewed.  Constitutional:      General: He is not in acute distress.    Appearance: He is well-developed. He is not toxic-appearing or diaphoretic.  HENT:     Head: Normocephalic and atraumatic.     Right Ear: Tympanic membrane, ear canal and external ear normal.     Left Ear: Tympanic membrane, ear canal and external ear normal.     Nose: Nose normal.     Mouth/Throat:     Pharynx: Uvula midline. No oropharyngeal exudate.     Tonsils: No tonsillar abscesses.  Eyes:     General: No scleral icterus.       Right eye: No discharge.        Left eye: No discharge.  Neck:     Musculoskeletal: Normal range of motion and neck supple.     Thyroid: No thyromegaly.     Trachea: No tracheal deviation.  Cardiovascular:     Rate and Rhythm: Normal rate and regular rhythm.     Heart sounds: Normal heart sounds.  Pulmonary:     Effort: Pulmonary effort is normal. No respiratory distress.     Breath  sounds: Normal breath sounds. No stridor. No wheezing, rhonchi or rales.  Chest:  Chest wall: No tenderness.  Lymphadenopathy:     Cervical: No cervical adenopathy.  Skin:    General: Skin is warm and dry.     Findings: No rash.  Neurological:     Mental Status: He is alert.      UC Treatments / Results  Labs (all labs ordered are listed, but only abnormal results are displayed) Labs Reviewed - No data to display  EKG None  Radiology No results found.  Procedures Procedures (including critical care time)  Medications Ordered in UC Medications - No data to display  Initial Impression / Assessment and Plan / UC Course  I have reviewed the triage vital signs and the nursing notes.  Pertinent labs & imaging results that were available during my care of the patient were reviewed by me and considered in my medical decision making (see chart for details).      Final Clinical Impressions(s) / UC Diagnoses   Final diagnoses:  Viral URI with cough   Discharge Instructions   None    ED Prescriptions    Medication Sig Dispense Auth. Provider   chlorpheniramine-HYDROcodone (TUSSIONEX PENNKINETIC ER) 10-8 MG/5ML SUER Take 5 mLs by mouth at bedtime as needed. 60 mL Norval Gable, MD      1. diagnosis reviewed with patient 2. rx as per orders above; reviewed possible side effects, interactions, risks and benefits  3. Recommend supportive treatment with rest, fluids  4. Follow-up prn if symptoms worsen or don't improve   Controlled Substance Prescriptions [place] Controlled Substance Registry consulted? Not Applicable   Norval Gable, MD 03/28/18 1004

## 2018-03-28 NOTE — ED Triage Notes (Signed)
Pt c/o cough, nasal congestion, chest congestion, and hoarseness. Started 4 days ago. He reports that he gets SOB when he is coughing.

## 2018-07-27 ENCOUNTER — Other Ambulatory Visit: Payer: Self-pay | Admitting: Urology

## 2018-07-27 MED ORDER — OXYBUTYNIN CHLORIDE 5 MG PO TABS: 5.0000 mg | ORAL_TABLET | Freq: Two times a day (BID) | ORAL | 3 refills | Status: DC

## 2018-07-27 NOTE — Telephone Encounter (Signed)
Refilled sent in to pharmacy-patient notified.

## 2018-07-27 NOTE — Telephone Encounter (Signed)
Patient requesting a refill of oxybutynin to be sent to the [facility] on [address].

## 2019-01-24 ENCOUNTER — Other Ambulatory Visit: Payer: Self-pay | Admitting: Urology

## 2019-01-26 ENCOUNTER — Telehealth: Payer: Self-pay | Admitting: Urology

## 2019-01-26 NOTE — Telephone Encounter (Signed)
It appears pt has not been seen in over a year, please call pt to schedule appt. Thanks

## 2019-01-26 NOTE — Telephone Encounter (Signed)
Pt. Was told by Pharmacy to contact office for refill request.

## 2019-01-27 NOTE — Telephone Encounter (Signed)
LMOM for pt to call back to schedule appt

## 2019-01-30 MED ORDER — OXYBUTYNIN CHLORIDE 5 MG PO TABS: 5.0000 mg | ORAL_TABLET | Freq: Two times a day (BID) | ORAL | 0 refills | Status: DC

## 2019-01-30 NOTE — Telephone Encounter (Signed)
Oxybutynin rx sent to pharmacy.

## 2019-01-30 NOTE — Telephone Encounter (Signed)
Pt made appt with Dr Bernardo Heater for follow up, first available was 02/16/2019, he asked if meds could be refilled prior to appt.

## 2019-02-16 ENCOUNTER — Encounter: Payer: Self-pay | Admitting: Urology

## 2019-02-16 ENCOUNTER — Other Ambulatory Visit: Payer: Self-pay

## 2019-02-16 ENCOUNTER — Ambulatory Visit (INDEPENDENT_AMBULATORY_CARE_PROVIDER_SITE_OTHER): Payer: Medicare HMO | Admitting: Urology

## 2019-02-16 VITALS — BP 148/75 | HR 73 | Ht 70.0 in | Wt 184.0 lb

## 2019-02-16 DIAGNOSIS — N400 Enlarged prostate without lower urinary tract symptoms: Secondary | ICD-10-CM | POA: Diagnosis not present

## 2019-02-16 LAB — BLADDER SCAN AMB NON-IMAGING: Scan Result: 27

## 2019-02-16 NOTE — Progress Notes (Signed)
02/16/2019 1:41 PM   Paul Marquez 01/10/1941 [id number]  Referring provider: [place] Pink, MD [address] [facility] [place],  [place] [address]  Chief Complaint  Patient presents with  . Follow-up    Urologic history: 1.  BPH with lower urinary tract symptoms -TURP 12/2012   HPI: 78 y.o. male presents for annual follow-up.  He was last seen March 2019.  At that visit he was given a trial of Myrbetriq for overactive bladder symptoms.  Although the medication was effective he was not able to afford.  He discontinued his tamsulosin several months ago and saw no worsening of his lower urinary tract symptoms.  He remains on immediate release oxybutynin.  Denies dysuria or gross hematuria.  He has no flank, abdominal or pelvic pain.   PMH: Past Medical History:  Diagnosis Date  . Asthma   . Coronary artery disease   . Depression   . GERD (gastroesophageal reflux disease)   . Hallux valgus, acquired   . Headache   . Hyperlipidemia   . Hypertension   . TIA (transient ischemic attack)     Surgical History: Past Surgical History:  Procedure Laterality Date  . COLONOSCOPY    . COLONOSCOPY WITH PROPOFOL N/A 01/27/2017   Procedure: COLONOSCOPY WITH PROPOFOL;  Surgeon: Toledo, Benay Pike, MD;  Location: [facility];  Service: Gastroenterology;  Laterality: N/A;  . CORONARY ANGIOPLASTY    . CORONARY STENT PLACEMENT    . ESOPHAGOGASTRODUODENOSCOPY (EGD) WITH PROPOFOL N/A 01/27/2017   Procedure: ESOPHAGOGASTRODUODENOSCOPY (EGD) WITH PROPOFOL;  Surgeon: Toledo, Benay Pike, MD;  Location: [facility];  Service: Gastroenterology;  Laterality: N/A;  . EYE SURGERY    . HERNIA REPAIR    . PROSTATE SURGERY    . SHOULDER SURGERY    . tdap    . UPPER GASTROINTESTINAL ENDOSCOPY      Home Medications:  Allergies as of 02/16/2019      Reactions   Aspirin-caffeine    Other reaction(s): Unknown   Other    ansyn   Zoloft [sertraline]       Medication List        Accurate as of February 16, 2019 11:59 PM. If you have any questions, ask your nurse or doctor.        amLODipine 10 MG tablet Commonly known as: NORVASC Take 1 tablet by mouth daily.   aspirin EC 81 MG tablet Take 1 tablet by mouth daily.   chlorpheniramine-HYDROcodone 10-8 MG/5ML Suer Commonly known as: Tussionex Pennkinetic ER Take 5 mLs by mouth at bedtime as needed.   clopidogrel 75 MG tablet Commonly known as: PLAVIX Take 1 tablet by mouth daily.   [facility]S Potassium Gluconate 2 MEQ Tabs Generic drug: Potassium Gluconate Take 1 tablet by mouth daily.   cyanocobalamin 1000 MCG tablet Take 1,000 mcg by mouth daily.   EQL Natural Zinc 50 MG Tabs Take 1 tablet by mouth daily.   meclizine 25 MG tablet Commonly known as: ANTIVERT Take 1 tablet (25 mg total) by mouth 3 (three) times daily as needed for dizziness or nausea.   mirabegron ER 50 MG Tb24 tablet Commonly known as: MYRBETRIQ Take 1 tablet (50 mg total) by mouth daily.   omeprazole 20 MG capsule Commonly known as: PRILOSEC Take 1 capsule by mouth daily.   oxybutynin 5 MG tablet Commonly known as: DITROPAN Take 1 tablet (5 mg total) by mouth 2 (two) times daily.   simvastatin 20 MG tablet Commonly known as: ZOCOR Take  1 tablet by mouth daily.   tamsulosin 0.4 MG Caps capsule Commonly known as: FLOMAX Take 0.4 mg by mouth.   Venlafaxine HCl 150 MG Tb24 Take 1 tablet by mouth daily.   Vitamin D3 100000 UNIT/GM Powd by Does not apply route.       Allergies:  Allergies  Allergen Reactions  . Aspirin-Caffeine     Other reaction(s): Unknown  . Other     ansyn  . Zoloft [Sertraline]     Family History: Family History  Problem Relation Age of Onset  . Stroke Father     Social History:  reports that he quit smoking about 34 years ago. He has never used smokeless tobacco. He reports that he does not drink alcohol or use drugs.  ROS: No significant change from 06/08/2017  Physical Exam: BP (!)  148/75   Pulse 73   Ht 5\' 10"  (1.778 m)   Wt 184 lb (83.5 kg)   BMI 26.40 kg/m   Constitutional:  Alert and oriented, No acute distress. HEENT: [place] AT, moist mucus membranes.  Trachea midline, no masses. Cardiovascular: No clubbing, cyanosis, or edema. Respiratory: Normal respiratory effort, no increased work of breathing. GI: Abdomen is soft, nontender, nondistended, no abdominal masses GU: No CVA tenderness Skin: No rashes, bruises or suspicious lesions. Neurologic: Grossly intact, no focal deficits, moving all 4 extremities. Psychiatric: Normal mood and affect.   Assessment & Plan:    Lower urinary tract symptoms/overactive bladder He has discontinued tamsulosin and has seen no worsening of his voiding symptoms.  PVR by bladder scan today was 27 mL.  Immediate release oxybutynin has been effective for his frequency and urgency.  Will change to an extended release oxybutynin and Rx was sent to pharmacy.  Return in about 1 year (around 02/16/2020) for Recheck, Bladder scan.   Abbie Sons, [place] [address], [place], [place] [address] [phone]

## 2019-02-17 ENCOUNTER — Other Ambulatory Visit: Payer: Self-pay

## 2019-02-17 DIAGNOSIS — N400 Enlarged prostate without lower urinary tract symptoms: Secondary | ICD-10-CM

## 2019-02-17 MED ORDER — OXYBUTYNIN CHLORIDE 5 MG PO TABS: 5.0000 mg | ORAL_TABLET | Freq: Two times a day (BID) | ORAL | 6 refills | Status: DC

## 2019-02-19 ENCOUNTER — Encounter: Payer: Self-pay | Admitting: Urology

## 2019-02-19 MED ORDER — OXYBUTYNIN CHLORIDE ER 10 MG PO TB24: 10.0000 mg | ORAL_TABLET | Freq: Every day | ORAL | 11 refills | Status: DC

## 2019-09-12 ENCOUNTER — Other Ambulatory Visit: Payer: Self-pay | Admitting: Urology

## 2019-09-12 DIAGNOSIS — N400 Enlarged prostate without lower urinary tract symptoms: Secondary | ICD-10-CM

## 2019-09-13 ENCOUNTER — Other Ambulatory Visit: Payer: Self-pay | Admitting: Urology

## 2019-09-13 DIAGNOSIS — N400 Enlarged prostate without lower urinary tract symptoms: Secondary | ICD-10-CM

## 2019-09-14 ENCOUNTER — Other Ambulatory Visit: Payer: Self-pay

## 2019-09-14 ENCOUNTER — Other Ambulatory Visit: Payer: Self-pay | Admitting: *Deleted

## 2019-09-14 MED ORDER — OXYBUTYNIN CHLORIDE ER 10 MG PO TB24: 10.0000 mg | ORAL_TABLET | Freq: Every day | ORAL | 11 refills | Status: DC

## 2019-09-14 MED ORDER — OXYBUTYNIN CHLORIDE 5 MG PO TABS: 5.0000 mg | ORAL_TABLET | Freq: Two times a day (BID) | ORAL | 11 refills | Status: DC

## 2019-09-14 NOTE — Progress Notes (Signed)
[facility] called regarding Oxybutynin prescription was too expensive, sent in 5 mg vs 10 mg due to cost of the extended release tablet.

## 2019-10-10 IMAGING — US US ABDOMEN LIMITED
1 series · 8 of 8 positions shown · non-contrast
Comparison: CT abdomen and pelvis 08/05/2015, 05/04/2012.

CLINICAL DATA: Periumbilical abdominal pain. Prior umbilical hernia
repair approximately 15 years ago. Evaluate for possible recurrence
just to the RIGHT of the umbilicus.

EXAM:
ULTRASOUND ABDOMEN LIMITED

[Series 1: us abdomen limited · 0.09mm/px · 8 acquisitions, 8 frames shown]
[im 1/8]
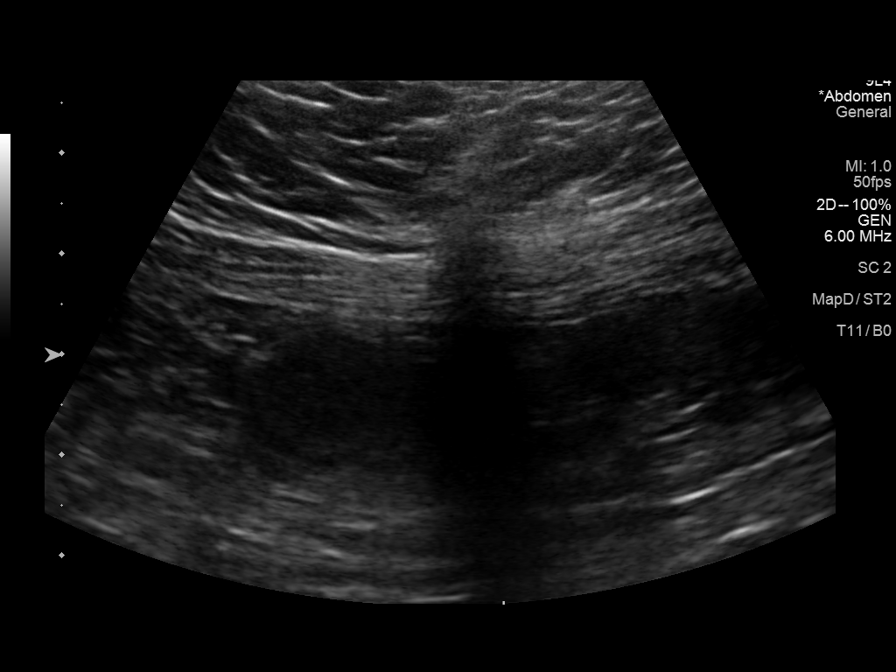
[im 2/8]
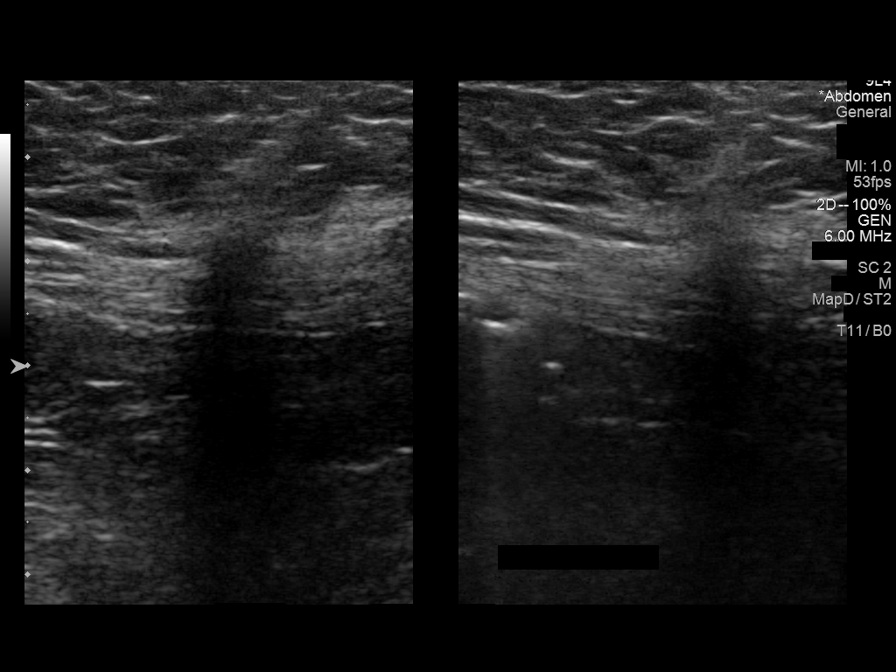
[im 3/8]
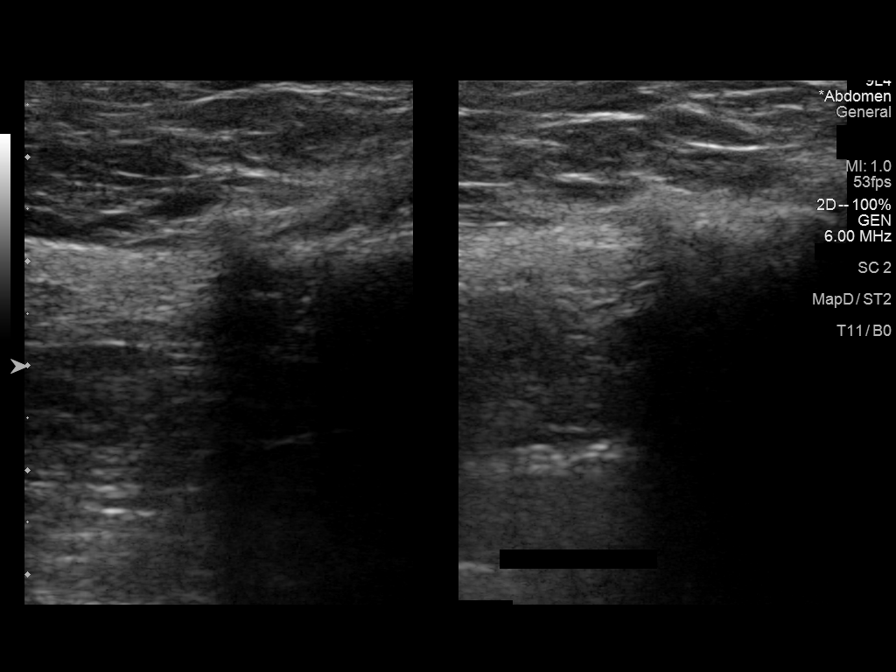
[im 4/8]
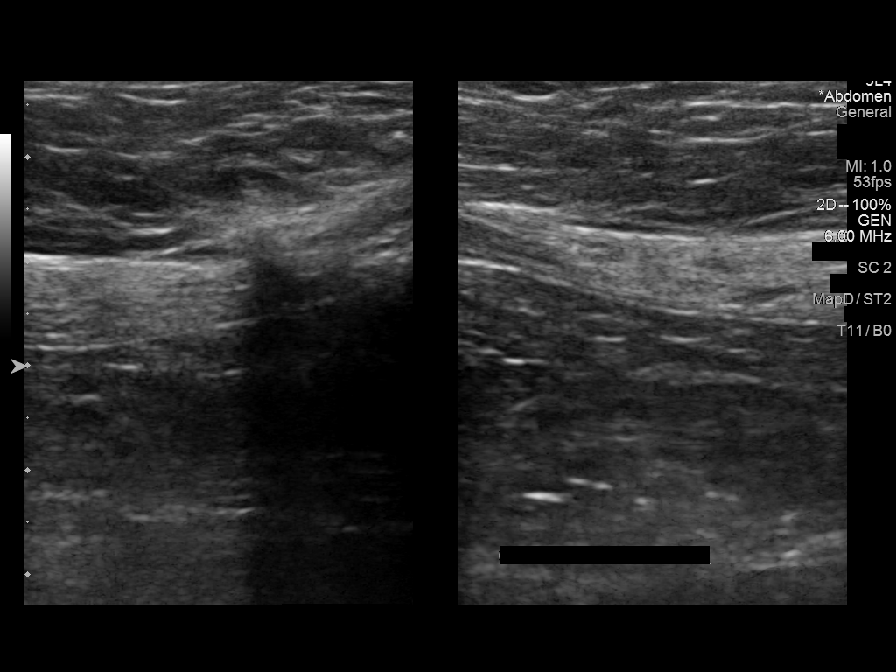
[im 5/8]
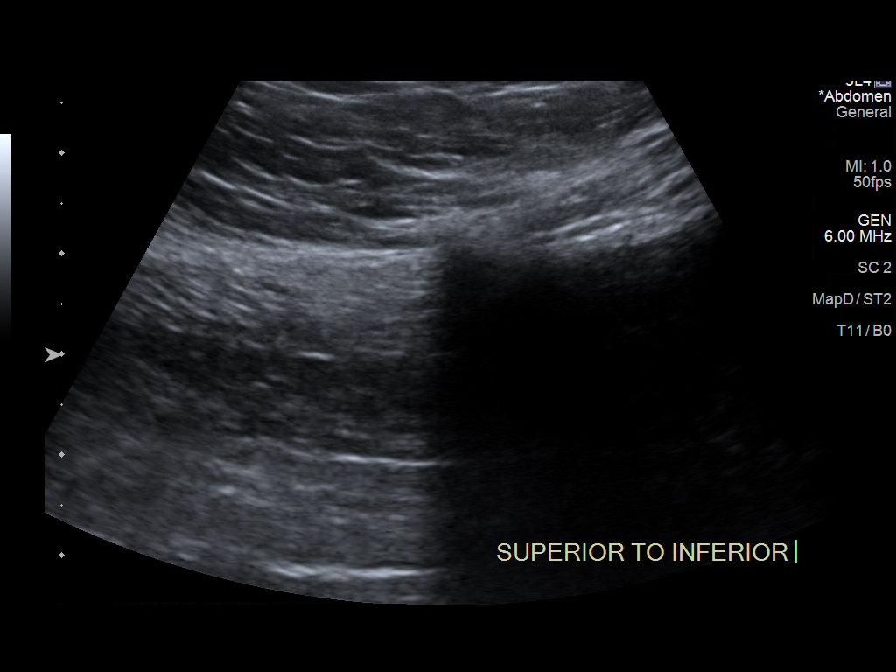
[im 6/8]
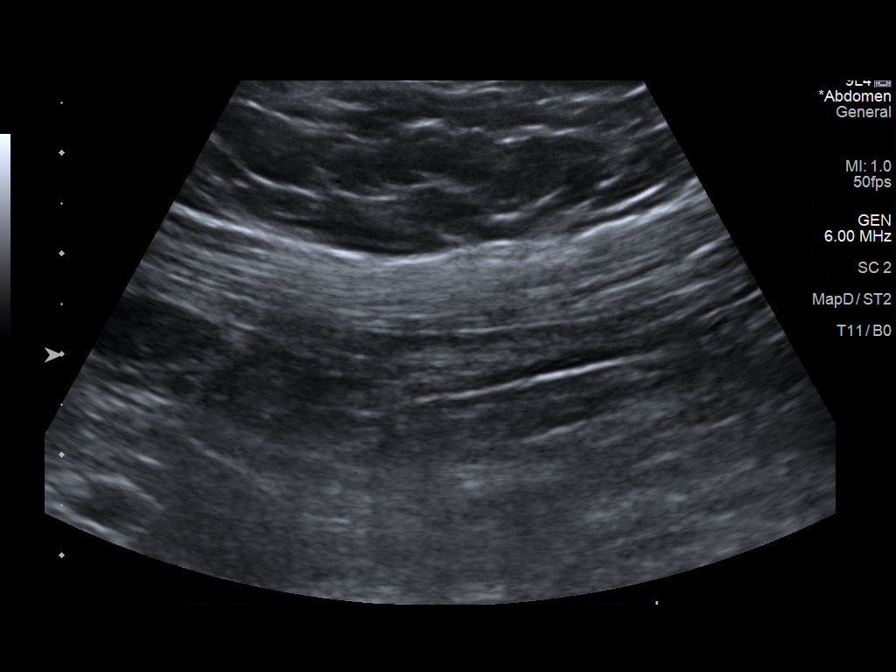
[im 7/8]
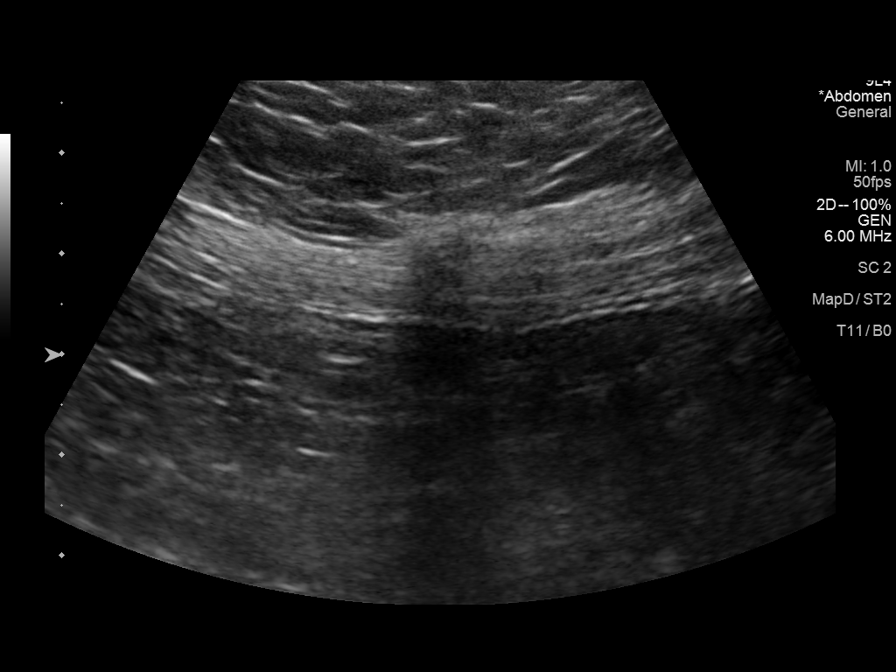
[im 8/8]
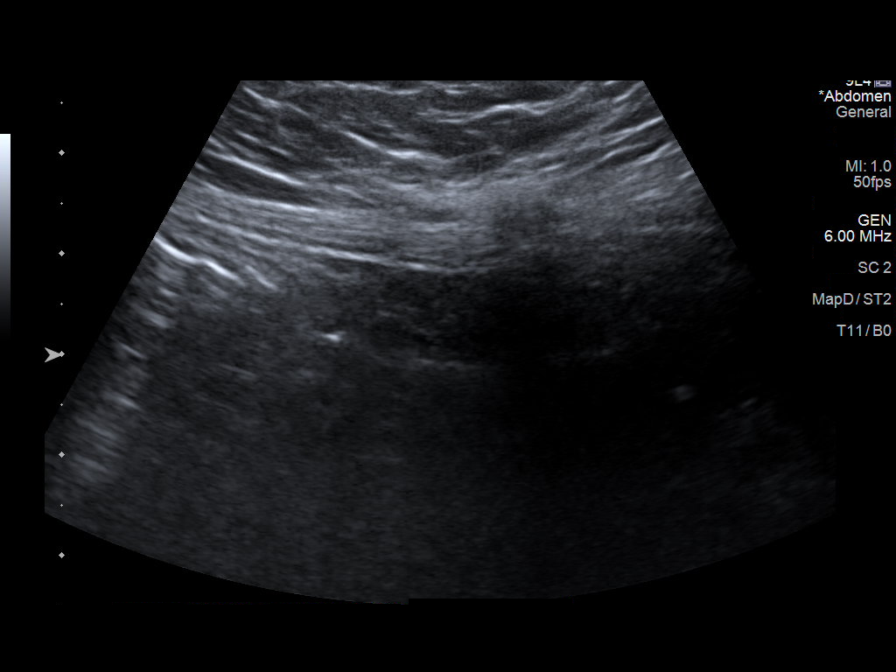

[8 of 8 positions shown; findings below may reference images not displayed]

FINDINGS: Focal shadowing just to the RIGHT of the umbilicus in the area of
clinical concern is likely related to the surgical plug placed at
the time of hernia repair. No evidence of recurrent periumbilical
hernia during Valsalva.
IMPRESSION: No evidence of recurrent umbilical/periumbilical hernia.

## 2019-11-08 ENCOUNTER — Encounter: Payer: Self-pay | Admitting: Dermatology

## 2019-11-08 ENCOUNTER — Ambulatory Visit: Payer: Medicare HMO | Admitting: Dermatology

## 2019-11-08 ENCOUNTER — Other Ambulatory Visit: Payer: Self-pay

## 2019-11-08 DIAGNOSIS — L578 Other skin changes due to chronic exposure to nonionizing radiation: Secondary | ICD-10-CM | POA: Diagnosis not present

## 2019-11-08 DIAGNOSIS — D485 Neoplasm of uncertain behavior of skin: Secondary | ICD-10-CM

## 2019-11-08 DIAGNOSIS — C44622 Squamous cell carcinoma of skin of right upper limb, including shoulder: Secondary | ICD-10-CM | POA: Diagnosis not present

## 2019-11-08 DIAGNOSIS — C4492 Squamous cell carcinoma of skin, unspecified: Secondary | ICD-10-CM

## 2019-11-08 HISTORY — DX: Squamous cell carcinoma of skin, unspecified: C44.92

## 2019-11-08 NOTE — Progress Notes (Signed)
   New Patient Visit  Subjective  Paul Marquez is a 79 y.o. male who presents for the following: Other (Spot on right forearm x 1 month. It has gotten sore and is getting larger.).  The following portions of the chart were reviewed this encounter and updated as appropriate:  Tobacco  Allergies  Meds  Problems  Med Hx  Surg Hx  Fam Hx     Review of Systems:  No other skin or systemic complaints except as noted in HPI or Assessment and Plan.  Objective  Well appearing patient in no apparent distress; mood and affect are within normal limits.  A focused examination was performed including right forearm, right axilla lymphnodes. Relevant physical exam findings are noted in the Assessment and Plan.  Objective  Right Forearm - Posterior: 1.6 cm hyperkeratotic papule. No lymphadenopathy.   Assessment & Plan  Neoplasm of uncertain behavior of skin Right Forearm - Posterior  Epidermal / dermal shaving  Lesion diameter (cm):  1.6 Informed consent: discussed and consent obtained   Timeout: patient name, date of birth, surgical site, and procedure verified   Procedure prep:  Patient was prepped and draped in usual sterile fashion Prep type:  Isopropyl alcohol Anesthesia: the lesion was anesthetized in a standard fashion   Anesthetic:  1% lidocaine w/ epinephrine 1-100,000 buffered w/ 8.4% NaHCO3 Instrument used: flexible razor blade   Hemostasis achieved with: pressure, aluminum chloride and electrodesiccation   Outcome: patient tolerated procedure well   Post-procedure details: sterile dressing applied and wound care instructions given   Dressing type: bandage and petrolatum    Destruction of lesion Complexity: extensive   Destruction method: electrodesiccation and curettage   Informed consent: discussed and consent obtained   Timeout:  patient name, date of birth, surgical site, and procedure verified Procedure prep:  Patient was prepped and draped in usual sterile  fashion Prep type:  Isopropyl alcohol Anesthesia: the lesion was anesthetized in a standard fashion   Anesthetic:  1% lidocaine w/ epinephrine 1-100,000 buffered w/ 8.4% NaHCO3 Curettage performed in three different directions: Yes   Electrodesiccation performed over the curetted area: Yes   Lesion length (cm):  1.6 Lesion width (cm):  1.6 Margin per side (cm):  0.2 Final wound size (cm):  2 Hemostasis achieved with:  pressure and aluminum chloride Outcome: patient tolerated procedure well with no complications   Post-procedure details: sterile dressing applied and wound care instructions given   Dressing type: bandage and petrolatum    Specimen 1 - Surgical pathology Differential Diagnosis: SCC vs other  Check Margins: No 1.6 cm hyperkeratotic papule  SCC (squamous cell carcinoma), arm, right  Actinic Damage - diffuse scaly erythematous macules with underlying dyspigmentation - Recommend daily broad spectrum sunscreen SPF 30+ to sun-exposed areas, reapply every 2 hours as needed.  - Call for new or changing lesions.  Return for follow up in 3-6 months.  I, Ashok Cordia, CMA, am acting as scribe for Sarina Ser, MD .  Documentation: I have reviewed the above documentation for accuracy and completeness, and I agree with the above.  Sarina Ser, MD

## 2019-11-08 NOTE — Patient Instructions (Signed)

## 2019-11-16 ENCOUNTER — Telehealth: Payer: Self-pay

## 2019-11-16 NOTE — Telephone Encounter (Signed)
Left message for patient to call office for results/hd 

## 2019-11-16 NOTE — Telephone Encounter (Signed)
-----   Message from Ralene Bathe, MD sent at 11/15/2019 11:46 AM EDT ----- Skin , right forearm - posterior WELL DIFFERENTIATED SQUAMOUS CELL CARCINOMA  Cancer - SCC Already treated Recheck next visit

## 2019-11-20 NOTE — Telephone Encounter (Signed)
Patient advised of biopsy result. 

## 2020-02-19 ENCOUNTER — Ambulatory Visit: Payer: Medicare HMO | Admitting: Urology

## 2020-03-11 ENCOUNTER — Other Ambulatory Visit: Payer: Self-pay

## 2020-03-11 ENCOUNTER — Ambulatory Visit: Payer: Medicare HMO | Admitting: Dermatology

## 2020-03-11 DIAGNOSIS — L818 Other specified disorders of pigmentation: Secondary | ICD-10-CM

## 2020-03-11 DIAGNOSIS — L814 Other melanin hyperpigmentation: Secondary | ICD-10-CM

## 2020-03-11 DIAGNOSIS — L821 Other seborrheic keratosis: Secondary | ICD-10-CM

## 2020-03-11 DIAGNOSIS — L720 Epidermal cyst: Secondary | ICD-10-CM | POA: Diagnosis not present

## 2020-03-11 DIAGNOSIS — B353 Tinea pedis: Secondary | ICD-10-CM

## 2020-03-11 DIAGNOSIS — Z1283 Encounter for screening for malignant neoplasm of skin: Secondary | ICD-10-CM

## 2020-03-11 DIAGNOSIS — L738 Other specified follicular disorders: Secondary | ICD-10-CM

## 2020-03-11 DIAGNOSIS — L578 Other skin changes due to chronic exposure to nonionizing radiation: Secondary | ICD-10-CM

## 2020-03-11 DIAGNOSIS — B351 Tinea unguium: Secondary | ICD-10-CM

## 2020-03-11 DIAGNOSIS — Z85828 Personal history of other malignant neoplasm of skin: Secondary | ICD-10-CM

## 2020-03-11 DIAGNOSIS — D1800 Hemangioma unspecified site: Secondary | ICD-10-CM

## 2020-03-11 DIAGNOSIS — D229 Melanocytic nevi, unspecified: Secondary | ICD-10-CM

## 2020-03-11 MED ORDER — KETOCONAZOLE 2 % EX CREA: TOPICAL_CREAM | CUTANEOUS | 11 refills | Status: DC

## 2020-03-11 NOTE — Progress Notes (Signed)
   Follow-Up Visit   Subjective  Paul Marquez is a 79 y.o. male who presents for the following: Annual Exam (Hx SCC ). The patient presents for Total-Body Skin Exam (TBSE) for skin cancer screening and mole check.  The following portions of the chart were reviewed this encounter and updated as appropriate:   Tobacco  Allergies  Meds  Problems  Med Hx  Surg Hx  Fam Hx     Review of Systems:  No other skin or systemic complaints except as noted in HPI or Assessment and Plan.  Objective  Well appearing patient in no apparent distress; mood and affect are within normal limits.  A full examination was performed including scalp, head, eyes, ears, nose, lips, neck, chest, axillae, abdomen, back, buttocks, bilateral upper extremities, bilateral lower extremities, hands, feet, fingers, toes, fingernails, and toenails. All findings within normal limits unless otherwise noted below.  Objective  Scalp: Blue papule.  Images    Objective  R nasal tip: Small yellow papules with a central dell.   Objective  L scrotum: Firm SQ nodule   Objective  Left Foot - Anterior: Nail dystrophy and scale on the feet.   Assessment & Plan  Tattoo Scalp From trauma years ago Traumatic tattoo vs blue nevus - benign appearing, observe.  Sebaceous hyperplasia of face R nasal tip Discussed removal if bothersome. Will plan to treat after the holidays.  Epidermal inclusion cyst L scrotum Benign appearing, discussed surgical removal.  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   ketoconazole (NIZORAL) 2 % cream - Left Foot - Anterior  Lentigines - Scattered tan macules - Discussed due to sun exposure - Benign, observe - Call for any changes  Seborrheic Keratoses - Stuck-on, waxy, tan-brown papules and plaques  - Discussed benign etiology and prognosis. - Observe - Call for any  changes  Melanocytic Nevi - Tan-brown and/or pink-flesh-colored symmetric macules and papules - Benign appearing on exam today - Observation - Call clinic for new or changing moles - Recommend daily use of broad spectrum spf 30+ sunscreen to sun-exposed areas.   Hemangiomas - Red papules - Discussed benign nature - Observe - Call for any changes  Actinic Damage - Chronic, secondary to cumulative UV/sun exposure - diffuse scaly erythematous macules with underlying dyspigmentation - Recommend daily broad spectrum sunscreen SPF 30+ to sun-exposed areas, reapply every 2 hours as needed.  - Call for new or changing lesions.  History of Squamous Cell Carcinoma of the Skin - No evidence of recurrence today - No lymphadenopathy - Recommend regular full body skin exams - Recommend daily broad spectrum sunscreen SPF 30+ to sun-exposed areas, reapply every 2 hours as needed.  - Call if any new or changing lesions are noted between office visits  Skin cancer screening performed today.  Return for [facility] removal - schedule on a Tuesday at the end of the day.  Luther Redo, CMA, am acting as scribe for Sarina Ser, MD .  Documentation: I have reviewed the above documentation for accuracy and completeness, and I agree with the above.  Sarina Ser, MD

## 2020-03-13 ENCOUNTER — Other Ambulatory Visit: Payer: Self-pay | Admitting: Urology

## 2020-03-15 ENCOUNTER — Encounter: Payer: Self-pay | Admitting: Dermatology

## 2020-04-03 ENCOUNTER — Other Ambulatory Visit: Payer: Self-pay

## 2020-04-03 ENCOUNTER — Ambulatory Visit: Payer: Medicare HMO | Admitting: Urology

## 2020-04-03 ENCOUNTER — Encounter: Payer: Self-pay | Admitting: Urology

## 2020-04-03 VITALS — BP 140/82 | HR 76 | Ht 70.0 in | Wt 174.0 lb

## 2020-04-03 DIAGNOSIS — N400 Enlarged prostate without lower urinary tract symptoms: Secondary | ICD-10-CM | POA: Diagnosis not present

## 2020-04-03 DIAGNOSIS — N401 Enlarged prostate with lower urinary tract symptoms: Secondary | ICD-10-CM

## 2020-04-03 LAB — BLADDER SCAN AMB NON-IMAGING: Scan Result: 36

## 2020-04-03 MED ORDER — OXYBUTYNIN CHLORIDE 5 MG PO TABS: 5.0000 mg | ORAL_TABLET | Freq: Three times a day (TID) | ORAL | 3 refills | Status: DC

## 2020-04-03 NOTE — Progress Notes (Signed)
04/03/2020 1:02 PM   De Blanch 1940-08-01 [id number]  Referring provider: Jerl Mina, MD [address] [facility] [place],  [place] [address]   Chief Complaint  Patient presents with  . Follow-up    Urologic history: 1.  BPH with lower urinary tract symptoms -TURP 12/2012  HPI: 79 y.o. male presents for annual follow-up.   Most bothersome symptoms are storage related LUTS  Trial of Myrbetriq in 2019 was effective however he could not afford this medication  Based on his formulary immediate release oxybutynin was the least expensive and the medication was effective  At his visit last year he elected to trial of extended release oxybutynin however this was again cost prohibitive on his formulary  Currently taking oxybutynin IR 5 mg twice daily  He had to reschedule his appointment and ran out of this medication approximately 2 weeks ago with worsening voiding symptoms  Denies dysuria, gross hematuria  No flank, abdominal or pelvic pain  Voids with an excellent stream   PMH: Past Medical History:  Diagnosis Date  . Asthma   . Coronary artery disease   . Depression   . GERD (gastroesophageal reflux disease)   . Hallux valgus, acquired   . Headache   . Hyperlipidemia   . Hypertension   . TIA (transient ischemic attack)     Surgical History: Past Surgical History:  Procedure Laterality Date  . COLONOSCOPY    . COLONOSCOPY WITH PROPOFOL N/A 01/27/2017   Procedure: COLONOSCOPY WITH PROPOFOL;  Surgeon: Toledo, Boykin Nearing, MD;  Location: [facility] ENDOSCOPY;  Service: Gastroenterology;  Laterality: N/A;  . CORONARY ANGIOPLASTY    . CORONARY STENT PLACEMENT    . ESOPHAGOGASTRODUODENOSCOPY (EGD) WITH PROPOFOL N/A 01/27/2017   Procedure: ESOPHAGOGASTRODUODENOSCOPY (EGD) WITH PROPOFOL;  Surgeon: Toledo, Boykin Nearing, MD;  Location: [facility] ENDOSCOPY;  Service: Gastroenterology;  Laterality: N/A;  . EYE SURGERY    . HERNIA REPAIR    . PROSTATE SURGERY    .  SHOULDER SURGERY    . tdap    . UPPER GASTROINTESTINAL ENDOSCOPY      Home Medications:  Allergies as of 04/03/2020      Reactions   Aspirin-caffeine    Other reaction(s): Unknown   Other    ansyn   Zoloft [sertraline]       Medication List       Accurate as of April 03, 2020  1:02 PM. If you have any questions, ask your nurse or doctor.        amLODipine 10 MG tablet Commonly known as: NORVASC Take 1 tablet by mouth daily.   aspirin EC 81 MG tablet Take 1 tablet by mouth daily.   chlorpheniramine-HYDROcodone 10-8 MG/5ML Suer Commonly known as: Tussionex Pennkinetic ER Take 5 mLs by mouth at bedtime as needed.   clopidogrel 75 MG tablet Commonly known as: PLAVIX Take 1 tablet by mouth daily.   [facility] Potassium Gluconate 2 MEQ Tabs Generic drug: Potassium Gluconate Take 1 tablet by mouth daily.   cyanocobalamin 1000 MCG tablet Take 1,000 mcg by mouth daily.   EQL Natural Zinc 50 MG Tabs Take 1 tablet by mouth daily.   ketoconazole 2 % cream Commonly known as: NIZORAL Apply to the feet QHS   meclizine 25 MG tablet Commonly known as: ANTIVERT Take 1 tablet (25 mg total) by mouth 3 (three) times daily as needed for dizziness or nausea.   omeprazole 20 MG capsule Commonly known as: PRILOSEC Take 1 capsule by mouth daily.  oxybutynin 5 MG tablet Commonly known as: DITROPAN Take 1 tablet (5 mg total) by mouth 3 (three) times daily. What changed: when to take this Changed by: Abbie Sons, MD   simvastatin 20 MG tablet Commonly known as: ZOCOR Take 1 tablet by mouth daily.   Venlafaxine HCl 150 MG Tb24 Take 1 tablet by mouth daily.   Vitamin D3 100000 UNIT/GM Powd by Does not apply route.       Allergies:  Allergies  Allergen Reactions  . Aspirin-Caffeine     Other reaction(s): Unknown  . Other     ansyn  . Zoloft [Sertraline]     Family History: Family History  Problem Relation Age of Onset  . Stroke Father     Social History:   reports that he quit smoking about 36 years ago. He has never used smokeless tobacco. He reports that he does not drink alcohol and does not use drugs.   Physical Exam: BP 140/82   Pulse 76   Ht 5\' 10"  (1.778 m)   Wt 174 lb (78.9 kg)   BMI 24.97 kg/m   Constitutional:  Alert and oriented, No acute distress. HEENT: [place] AT, moist mucus membranes.  Trachea midline, no masses. Cardiovascular: No clubbing, cyanosis, or edema. Respiratory: Normal respiratory effort, no increased work of breathing. Neurologic: Grossly intact, no focal deficits, moving all 4 extremities. Psychiatric: Normal mood and affect.   Assessment & Plan:    1.  BPH with storage related voiding symptoms  Bladder scan PVR 36 mL  Oxybutynin IR was refilled and will increase to 3 times daily as needed  We discussed the availability of extended release oxybutynin and other anticholinergic medications with reasonable pricing through [facility] however he desires to continue present therapy  Continue annual follow-up   Abbie Sons, MD  [facility] [address] [place], [place] [address] [phone]

## 2020-05-23 ENCOUNTER — Other Ambulatory Visit
Admission: RE | Admit: 2020-05-23 | Discharge: 2020-05-23 | Disposition: A | Discharge status: Home / Self Care | Payer: Medicare HMO | Source: Ambulatory Visit | Attending: Internal Medicine | Admitting: Internal Medicine

## 2020-05-23 ENCOUNTER — Other Ambulatory Visit: Payer: Self-pay

## 2020-05-23 DIAGNOSIS — Z20822 Contact with and (suspected) exposure to covid-19: Secondary | ICD-10-CM | POA: Insufficient documentation

## 2020-05-23 DIAGNOSIS — Z01812 Encounter for preprocedural laboratory examination: Secondary | ICD-10-CM | POA: Insufficient documentation

## 2020-05-23 LAB — SARS CORONAVIRUS 2 (TAT 6-24 HRS): SARS Coronavirus 2: NEGATIVE

## 2020-05-24 ENCOUNTER — Encounter: Payer: Self-pay | Admitting: Internal Medicine

## 2020-05-27 ENCOUNTER — Encounter
Admission: RE | Disposition: A | Discharge status: Home / Self Care | Payer: Self-pay | Source: Home / Self Care | Attending: Internal Medicine

## 2020-05-27 ENCOUNTER — Other Ambulatory Visit: Payer: Self-pay

## 2020-05-27 ENCOUNTER — Ambulatory Visit
Admission: RE | Admit: 2020-05-27 | Discharge: 2020-05-27 | Disposition: A | Discharge status: Home / Self Care | Payer: Medicare HMO | Attending: Internal Medicine | Admitting: Internal Medicine

## 2020-05-27 ENCOUNTER — Ambulatory Visit: Payer: Medicare HMO | Admitting: Anesthesiology

## 2020-05-27 ENCOUNTER — Encounter: Payer: Self-pay | Admitting: Internal Medicine

## 2020-05-27 DIAGNOSIS — Z85828 Personal history of other malignant neoplasm of skin: Secondary | ICD-10-CM | POA: Insufficient documentation

## 2020-05-27 DIAGNOSIS — Z86010 Personal history of colonic polyps: Secondary | ICD-10-CM | POA: Diagnosis not present

## 2020-05-27 DIAGNOSIS — Z886 Allergy status to analgesic agent status: Secondary | ICD-10-CM | POA: Insufficient documentation

## 2020-05-27 DIAGNOSIS — K2289 Other specified disease of esophagus: Secondary | ICD-10-CM | POA: Insufficient documentation

## 2020-05-27 DIAGNOSIS — K2100 Gastro-esophageal reflux disease with esophagitis, without bleeding: Secondary | ICD-10-CM | POA: Insufficient documentation

## 2020-05-27 DIAGNOSIS — Z87891 Personal history of nicotine dependence: Secondary | ICD-10-CM | POA: Insufficient documentation

## 2020-05-27 DIAGNOSIS — Z888 Allergy status to other drugs, medicaments and biological substances status: Secondary | ICD-10-CM | POA: Insufficient documentation

## 2020-05-27 DIAGNOSIS — K317 Polyp of stomach and duodenum: Secondary | ICD-10-CM | POA: Diagnosis not present

## 2020-05-27 DIAGNOSIS — Z09 Encounter for follow-up examination after completed treatment for conditions other than malignant neoplasm: Secondary | ICD-10-CM | POA: Insufficient documentation

## 2020-05-27 DIAGNOSIS — Z8673 Personal history of transient ischemic attack (TIA), and cerebral infarction without residual deficits: Secondary | ICD-10-CM | POA: Diagnosis not present

## 2020-05-27 DIAGNOSIS — K2270 Barrett's esophagus without dysplasia: Secondary | ICD-10-CM | POA: Diagnosis not present

## 2020-05-27 DIAGNOSIS — Z955 Presence of coronary angioplasty implant and graft: Secondary | ICD-10-CM | POA: Diagnosis not present

## 2020-05-27 DIAGNOSIS — Z7982 Long term (current) use of aspirin: Secondary | ICD-10-CM | POA: Diagnosis not present

## 2020-05-27 DIAGNOSIS — K449 Diaphragmatic hernia without obstruction or gangrene: Secondary | ICD-10-CM | POA: Diagnosis not present

## 2020-05-27 DIAGNOSIS — Z79899 Other long term (current) drug therapy: Secondary | ICD-10-CM | POA: Diagnosis not present

## 2020-05-27 DIAGNOSIS — R1310 Dysphagia, unspecified: Secondary | ICD-10-CM | POA: Diagnosis not present

## 2020-05-27 HISTORY — DX: Abdominal aortic aneurysm, without rupture, unspecified: I71.40

## 2020-05-27 HISTORY — DX: Barrett's esophagus without dysplasia: K22.70

## 2020-05-27 HISTORY — PX: COLONOSCOPY: SHX5424

## 2020-05-27 HISTORY — DX: Chronic obstructive pulmonary disease, unspecified: J44.9

## 2020-05-27 HISTORY — PX: ESOPHAGOGASTRODUODENOSCOPY (EGD) WITH PROPOFOL: SHX5813

## 2020-05-27 HISTORY — DX: Abdominal aortic aneurysm, without rupture: I71.4

## 2020-05-27 SURGERY — COLONOSCOPY
Anesthesia: General

## 2020-05-27 MED ORDER — PROPOFOL 500 MG/50ML IV EMUL
INTRAVENOUS | Status: DC | PRN
  Administered 2020-05-27: 120 ug/kg/min via INTRAVENOUS

## 2020-05-27 MED ORDER — PROPOFOL 10 MG/ML IV BOLUS
INTRAVENOUS | Status: DC | PRN
  Administered 2020-05-27: 20 mg via INTRAVENOUS
  Administered 2020-05-27: 80 mg via INTRAVENOUS

## 2020-05-27 MED ORDER — SODIUM CHLORIDE 0.9 % IV SOLN
INTRAVENOUS | Status: DC
  Administered 2020-05-27: 1000 mL via INTRAVENOUS

## 2020-05-27 MED ORDER — PROPOFOL 500 MG/50ML IV EMUL
INTRAVENOUS | Status: AC
  Filled 2020-05-27: qty 50

## 2020-05-27 NOTE — Transfer of Care (Signed)
Immediate Anesthesia Transfer of Care Note  Patient: Paul Marquez  Procedure(s) Performed: COLONOSCOPY (N/A ) ESOPHAGOGASTRODUODENOSCOPY (EGD) WITH PROPOFOL (N/A )  Patient Location: PACU and Endoscopy Unit  Anesthesia Type:General  Level of Consciousness: drowsy and patient cooperative  Airway & Oxygen Therapy: Patient Spontanous Breathing  Post-op Assessment: Report given to RN and Post -op Vital signs reviewed and stable  Post vital signs: Reviewed and stable  Last Vitals:  Vitals Value Taken Time  BP 120/69 05/27/20 1218  Temp 37 C 05/27/20 1217  Pulse 59 05/27/20 1219  Resp 15 05/27/20 1219  SpO2 98 % 05/27/20 1219  Vitals shown include unvalidated device data.  Last Pain:  Vitals:   05/27/20 1217  TempSrc: Temporal  PainSc: Asleep         Complications: No complications documented.

## 2020-05-27 NOTE — Op Note (Signed)
[facility] Gastroenterology Patient Name: Paul Marquez Procedure Date: 05/27/2020 11:39 AM MRN: [id number] Account #: [id number] Date of Birth: 1940-05-16 Admit Type: Outpatient Age: 80 Room: [facility] ENDO ROOM 2 Gender: Male Note Status: Finalized Procedure:             Colonoscopy Indications:           Surveillance: Personal history of adenomatous polyps                         on last colonoscopy > 3 years ago Providers:             Lorie Apley K. Alice Reichert MD, MD Referring MD:          Irven Easterly. Kary Kos, MD (Referring MD) Medicines:             Propofol per Anesthesia Complications:         No immediate complications. Procedure:             Pre-Anesthesia Assessment:                        - The risks and benefits of the procedure and the                         sedation options and risks were discussed with the                         patient. All questions were answered and informed                         consent was obtained.                        - Patient identification and proposed procedure were                         verified prior to the procedure by the nurse. The                         procedure was verified in the procedure room.                        - ASA Grade Assessment: III - A patient with severe                         systemic disease.                        - After reviewing the risks and benefits, the patient                         was deemed in satisfactory condition to undergo the                         procedure.                        After obtaining informed consent, the colonoscope was                         passed under direct  vision. Throughout the procedure,                         the patient's blood pressure, pulse, and oxygen                         saturations were monitored continuously. The                         Colonoscope was introduced through the anus and                         advanced to the the cecum, identified by  appendiceal                         orifice and ileocecal valve. The colonoscopy was                         performed without difficulty. The patient tolerated                         the procedure well. The quality of the bowel                         preparation was adequate. The ileocecal valve,                         appendiceal orifice, and rectum were photographed. Findings:      The perianal and digital rectal examinations were normal. Pertinent       negatives include normal sphincter tone and no palpable rectal lesions.      The entire examined colon appeared normal on direct and retroflexion       views. Impression:            - The entire examined colon is normal on direct and                         retroflexion views.                        - No specimens collected. Recommendation:        - Await pathology results from EGD, also performed                         today.                        - Patient has a contact number available for                         emergencies. The signs and symptoms of potential                         delayed complications were discussed with the patient.                         Return to normal activities tomorrow. Written                         discharge instructions were  provided to the patient.                        - Resume previous diet.                        - Continue present medications.                        - No repeat colonoscopy due to current age (37 years                         or older) and the absence of advanced adenomas.                        - Return to GI office in 1 year.                        - The findings and recommendations were discussed with                         the patient.                        - You do NOT require further colon cancer screening                         measures (Annual stool testing (i.e. hemoccult, FIT,                         cologuard), sigmoidoscopy, colonoscopy or CT                          colonography). You should share this recommendation                         with your Primary Care provider. Procedure Code(s):     --- Professional ---                        H3716, Colorectal cancer screening; colonoscopy on                         individual at high risk Diagnosis Code(s):     --- Professional ---                        Z86.010, Personal history of colonic polyps CPT copyright 2019 American Medical Association. All rights reserved. The codes documented in this report are preliminary and upon coder review may  be revised to meet current compliance requirements. Efrain Sella MD, MD 05/27/2020 12:21:29 PM This report has been signed electronically. Number of Addenda: 0 Note Initiated On: 05/27/2020 11:39 AM Scope Withdrawal Time: 0 hours 6 minutes 10 seconds  Total Procedure Duration: 0 hours 11 minutes 46 seconds  Estimated Blood Loss:  Estimated blood loss: none.      [facility]

## 2020-05-27 NOTE — Anesthesia Postprocedure Evaluation (Signed)
Anesthesia Post Note  Patient: CHANCELER PULLIN  Procedure(s) Performed: COLONOSCOPY (N/A ) ESOPHAGOGASTRODUODENOSCOPY (EGD) WITH PROPOFOL (N/A )  Patient location during evaluation: Phase II Anesthesia Type: General Level of consciousness: awake and alert, awake and oriented Pain management: pain level controlled Vital Signs Assessment: post-procedure vital signs reviewed and stable Respiratory status: spontaneous breathing, nonlabored ventilation and respiratory function stable Cardiovascular status: blood pressure returned to baseline and stable Postop Assessment: no apparent nausea or vomiting Anesthetic complications: no   No complications documented.   Last Vitals:  Vitals:   05/27/20 1217 05/27/20 1237  BP: 120/69 125/80  Pulse:    Resp: 20   Temp: 37 C   SpO2:      Last Pain:  Vitals:   05/27/20 1237  TempSrc:   PainSc: 0-No pain                 Phill Mutter

## 2020-05-27 NOTE — Anesthesia Preprocedure Evaluation (Signed)
Anesthesia Evaluation  Patient identified by MRN, date of birth, ID band Patient awake    Reviewed: Allergy & Precautions, H&P , NPO status , Patient's Chart, lab work & pertinent test results  History of Anesthesia Complications Negative for: history of anesthetic complications  Airway Mallampati: III  TM Distance: >3 FB Neck ROM: limited    Dental  (+) Missing, Poor Dentition   Pulmonary asthma , COPD, former smoker,    Pulmonary exam normal        Cardiovascular Exercise Tolerance: Good hypertension, (-) angina+ CAD  Normal cardiovascular exam     Neuro/Psych  Headaches, PSYCHIATRIC DISORDERS TIA   GI/Hepatic Neg liver ROS, GERD  Medicated and Controlled,  Endo/Other  negative endocrine ROS  Renal/GU negative Renal ROS  negative genitourinary   Musculoskeletal   Abdominal   Peds  Hematology negative hematology ROS (+)   Anesthesia Other Findings Past Medical History: No date: Abdominal aortic aneurysm without rupture (HCC) No date: Asthma No date: Barrett's esophagus No date: COPD (chronic obstructive pulmonary disease) (HCC) No date: Coronary artery disease No date: Depression No date: GERD (gastroesophageal reflux disease) No date: Hallux valgus, acquired No date: Headache No date: Hyperlipidemia No date: Hypertension 11/08/2019: Squamous cell carcinoma of skin     Comment:  WD SCC. Right dorsal forearm.  No date: TIA (transient ischemic attack)  Past Surgical History: No date: COLONOSCOPY 01/27/2017: COLONOSCOPY WITH PROPOFOL; N/A     Comment:  Procedure: COLONOSCOPY WITH PROPOFOL;  Surgeon: Toledo,               Benay Pike, MD;  Location: [facility] ENDOSCOPY;  Service:               Gastroenterology;  Laterality: N/A; No date: CORONARY ANGIOPLASTY No date: CORONARY STENT PLACEMENT 01/27/2017: ESOPHAGOGASTRODUODENOSCOPY (EGD) WITH PROPOFOL; N/A     Comment:  Procedure: ESOPHAGOGASTRODUODENOSCOPY  (EGD) WITH               PROPOFOL;  Surgeon: Toledo, Benay Pike, MD;  Location:               [facility] ENDOSCOPY;  Service: Gastroenterology;  Laterality:               N/A; No date: EYE SURGERY     Comment:  rt cataract extraction No date: HERNIA REPAIR No date: PROSTATE SURGERY No date: PROSTATE SURGERY No date: SHOULDER SURGERY No date: tdap No date: UPPER GASTROINTESTINAL ENDOSCOPY  BMI    Body Mass Index: 24.44 kg/m      Reproductive/Obstetrics negative OB ROS                             Anesthesia Physical Anesthesia Plan  ASA: III  Anesthesia Plan: General   Post-op Pain Management:    Induction: Intravenous  PONV Risk Score and Plan: Propofol infusion and TIVA  Airway Management Planned: Natural Airway and Nasal Cannula  Additional Equipment:   Intra-op Plan:   Post-operative Plan:   Informed Consent: I have reviewed the patients History and Physical, chart, labs and discussed the procedure including the risks, benefits and alternatives for the proposed anesthesia with the patient or authorized representative who has indicated his/her understanding and acceptance.     Dental Advisory Given  Plan Discussed with: Anesthesiologist, CRNA and Surgeon  Anesthesia Plan Comments: (Patient consented for risks of anesthesia including but not limited to:  - adverse reactions to medications - risk of airway placement  if required - damage to eyes, teeth, lips or other oral mucosa - nerve damage due to positioning  - sore throat or hoarseness - Damage to heart, brain, nerves, lungs, other parts of body or loss of life  Patient voiced understanding.)        Anesthesia Quick Evaluation

## 2020-05-27 NOTE — Interval H&P Note (Signed)
History and Physical Interval Note:  05/27/2020 11:36 AM  Ramond Dial  has presented today for surgery, with the diagnosis of personal hx of colon polyps, barretts esphagus without dysplasia, GERD.  The various methods of treatment have been discussed with the patient and family. After consideration of risks, benefits and other options for treatment, the patient has consented to  Procedure(s): COLONOSCOPY (N/A) ESOPHAGOGASTRODUODENOSCOPY (EGD) WITH PROPOFOL (N/A) as a surgical intervention.  The patient's history has been reviewed, patient examined, no change in status, stable for surgery.  I have reviewed the patient's chart and labs.  Questions were answered to the patient's satisfaction.     [place], [place]

## 2020-05-27 NOTE — H&P (Signed)
Outpatient short stay form Pre-procedure 05/27/2020 11:34 AM Teodoro K. Alice Reichert, M.D.  Primary Physician: [place] Pink, M.D.  Reason for visit: Dysphagia, GERD, personal history of colon polyps  History of present illness: Patient is an 80 y/o male presenting with GERD and intermittent pharyngeal dysphagia noticed soon after neck surgery. No food impaction in the esophagus. GERD reasonably well controlled.                           Patient presents for colonoscopy for a personal hx of colon polyps. The patient denies abdominal pain, abnormal weight loss or rectal bleeding.      Current Facility-Administered Medications:  .  0.9 %  sodium chloride infusion, , Intravenous, Continuous, [place], Benay Pike, MD, Last Rate: 20 mL/hr at 05/27/20 1035, 1,000 mL at 05/27/20 1035  Medications Prior to Admission  Medication Sig Dispense Refill Last Dose  . amLODipine (NORVASC) 10 MG tablet Take 1 tablet by mouth daily.   Past Week at Unknown time  . aspirin EC 81 MG tablet Take 1 tablet by mouth daily.   Past Week at Unknown time  . Cholecalciferol (VITAMIN D3) 100000 UNIT/GM POWD by Does not apply route.   Past Week at Unknown time  . cyanocobalamin 1000 MCG tablet Take 1,000 mcg by mouth daily.   Past Week at Unknown time  . EQL NATURAL ZINC 50 MG TABS Take 1 tablet by mouth daily.   Past Week at Unknown time  . hydrocortisone (ANUSOL-HC) 2.5 % rectal cream Place 1 application rectally 2 (two) times daily.    at prn  . Na Sulfate-K Sulfate-Mg Sulf (SUPREP BOWEL PREP KIT PO) Take by mouth.     Marland Kitchen omeprazole (PRILOSEC) 20 MG capsule Take 1 capsule by mouth daily.   05/26/2020 at Unknown time  . oxybutynin (DITROPAN) 5 MG tablet Take 1 tablet (5 mg total) by mouth 3 (three) times daily. 90 tablet 3 Past Week at Unknown time  . Potassium Gluconate 2 MEQ TABS Take 1 tablet by mouth daily.   Past Week at Unknown time  . simvastatin (ZOCOR) 20 MG tablet Take 1 tablet by mouth daily.   Past Week at Unknown  time  . Venlafaxine HCl 150 MG TB24 Take 1 tablet by mouth daily.   Past Week at Unknown time  . chlorpheniramine-HYDROcodone (TUSSIONEX PENNKINETIC ER) 10-8 MG/5ML SUER Take 5 mLs by mouth at bedtime as needed. 60 mL 0  at prn  . clopidogrel (PLAVIX) 75 MG tablet Take 1 tablet by mouth daily.  (Patient not taking: Reported on 05/27/2020)   Completed Course at Unknown time  . ketoconazole (NIZORAL) 2 % cream Apply to the feet QHS 60 g 11  at prn  . meclizine (ANTIVERT) 25 MG tablet Take 1 tablet (25 mg total) by mouth 3 (three) times daily as needed for dizziness or nausea. 20 tablet 0  at prn     Allergies  Allergen Reactions  . Aspirin-Caffeine     Other reaction(s): Unknown  . Other     ansyn  . Zoloft [Sertraline]      Past Medical History:  Diagnosis Date  . Abdominal aortic aneurysm without rupture ([employer])   . Asthma   . Barrett's esophagus   . COPD (chronic obstructive pulmonary disease) ([place])   . Coronary artery disease   . Depression   . GERD (gastroesophageal reflux disease)   . Hallux valgus, acquired   . Headache   .  Hyperlipidemia   . Hypertension   . Squamous cell carcinoma of skin 11/08/2019   WD SCC. Right dorsal forearm.   Marland Kitchen TIA (transient ischemic attack)     Review of systems:  Otherwise negative.    Physical Exam  Gen: Alert, oriented. Appears stated age.  HEENT: [place]/AT. PERRLA. Lungs: CTA, no wheezes. CV: RR nl S1, S2. Abd: soft, benign, no masses. BS+ Ext: No edema. Pulses 2+    Planned procedures: Proceed with EGD and colonoscopy. The patient understands the nature of the planned procedure, indications, risks, alternatives and potential complications including but not limited to bleeding, infection, perforation, damage to internal organs and possible oversedation/side effects from anesthesia. The patient agrees and gives consent to proceed.  Please refer to procedure notes for findings, recommendations and patient disposition/instructions.      Teodoro K. Alice Reichert, M.D. Gastroenterology 05/27/2020  11:34 AM

## 2020-05-27 NOTE — Op Note (Signed)
[facility] Gastroenterology Patient Name: Paul Marquez Procedure Date: 05/27/2020 11:39 AM MRN: [id number] Account #: [id number] Date of Birth: 11-Mar-1941 Admit Type: Outpatient Age: 80 Room: [facility] ENDO ROOM 2 Gender: Male Note Status: Finalized Procedure:             Upper GI endoscopy Indications:           Surveillance for malignancy due to personal history of                         Barrett's esophagus, Dysphagia, Gastro-esophageal                         reflux disease Providers:             Benay Pike. Alice Reichert MD, MD Referring MD:          Irven Easterly. Kary Kos, MD (Referring MD) Medicines:             Propofol per Anesthesia Complications:         No immediate complications. Procedure:             Pre-Anesthesia Assessment:                        - The risks and benefits of the procedure and the                         sedation options and risks were discussed with the                         patient. All questions were answered and informed                         consent was obtained.                        - Patient identification and proposed procedure were                         verified prior to the procedure by the nurse. The                         procedure was verified in the procedure room.                        - ASA Grade Assessment: III - A patient with severe                         systemic disease.                        - After reviewing the risks and benefits, the patient                         was deemed in satisfactory condition to undergo the                         procedure.                        After obtaining informed consent, the endoscope was  passed under direct vision. Throughout the procedure,                         the patient's blood pressure, pulse, and oxygen                         saturations were monitored continuously. The Endoscope                         was introduced through the mouth, and  advanced to the                         third part of duodenum. The upper GI endoscopy was                         accomplished without difficulty. The patient tolerated                         the procedure well. Findings:      There were esophageal mucosal changes secondary to established       long-segment Barrett's disease present in the lower third of the       esophagus. The maximum longitudinal extent of these mucosal changes was       5 cm in length. Mucosa was biopsied with a cold forceps for histology in       4 quadrants at intervals of 2 cm in the lower third of the esophagus. A       total of 3 specimen bottles were sent to pathology.      Moderate tortuosity of the mid to distal esophagus was noted compatible       with a diagnosis of Presbyesophagus.      There is no endoscopic evidence of stenosis or stricture in the entire       esophagus.      Multiple small pedunculated and sessile polyps with no bleeding and no       stigmata of recent bleeding were found in the gastric body. Biopsies       were taken with a cold forceps for histology.      A 1 cm hiatal hernia was present.      The examined duodenum was normal.      The exam was otherwise without abnormality. Impression:            - Esophageal mucosal changes secondary to established                         long-segment Barrett's disease. Biopsied.                        - Multiple gastric polyps. Biopsied.                        - 1 cm hiatal hernia.                        - Normal examined duodenum.                        - The examination was otherwise normal. Recommendation:        - Await pathology results.                        -  Proceed with colonoscopy Procedure Code(s):     --- Professional ---                        [phone], Esophagogastroduodenoscopy, flexible,                         transoral; with biopsy, single or multiple Diagnosis Code(s):     --- Professional ---                        K21.9,  Gastro-esophageal reflux disease without                         esophagitis                        R13.10, Dysphagia, unspecified                        K44.9, Diaphragmatic hernia without obstruction or                         gangrene                        K31.7, Polyp of stomach and duodenum                        K22.70, Barrett's esophagus without dysplasia CPT copyright 2019 American Medical Association. All rights reserved. The codes documented in this report are preliminary and upon coder review may  be revised to meet current compliance requirements. Efrain Sella MD, MD 05/27/2020 11:59:59 AM This report has been signed electronically. Number of Addenda: 0 Note Initiated On: 05/27/2020 11:39 AM Estimated Blood Loss:  Estimated blood loss was minimal.      [facility]

## 2020-05-28 ENCOUNTER — Encounter: Payer: Self-pay | Admitting: Internal Medicine

## 2020-05-28 ENCOUNTER — Ambulatory Visit: Payer: Medicare HMO | Admitting: Dermatology

## 2020-05-28 LAB — SURGICAL PATHOLOGY

## 2020-07-24 ENCOUNTER — Other Ambulatory Visit: Payer: Self-pay | Admitting: Urology

## 2020-07-27 ENCOUNTER — Other Ambulatory Visit: Payer: Self-pay | Admitting: Urology

## 2020-08-31 ENCOUNTER — Other Ambulatory Visit: Payer: Self-pay | Admitting: Urology

## 2020-09-10 ENCOUNTER — Other Ambulatory Visit: Payer: Self-pay | Admitting: Urology

## 2020-11-11 ENCOUNTER — Other Ambulatory Visit: Payer: Self-pay | Admitting: Urology

## 2021-04-03 ENCOUNTER — Ambulatory Visit: Payer: Self-pay | Admitting: Urology

## 2021-07-21 ENCOUNTER — Ambulatory Visit: Payer: Medicare HMO | Admitting: Dermatology

## 2021-07-21 DIAGNOSIS — L578 Other skin changes due to chronic exposure to nonionizing radiation: Secondary | ICD-10-CM | POA: Diagnosis not present

## 2021-07-21 DIAGNOSIS — D692 Other nonthrombocytopenic purpura: Secondary | ICD-10-CM | POA: Diagnosis not present

## 2021-07-21 DIAGNOSIS — L570 Actinic keratosis: Secondary | ICD-10-CM | POA: Diagnosis not present

## 2021-07-21 DIAGNOSIS — Z85828 Personal history of other malignant neoplasm of skin: Secondary | ICD-10-CM

## 2021-07-21 DIAGNOSIS — L711 Rhinophyma: Secondary | ICD-10-CM

## 2021-07-21 DIAGNOSIS — L821 Other seborrheic keratosis: Secondary | ICD-10-CM

## 2021-07-21 DIAGNOSIS — L719 Rosacea, unspecified: Secondary | ICD-10-CM | POA: Diagnosis not present

## 2021-07-21 NOTE — Patient Instructions (Signed)

## 2021-07-21 NOTE — Progress Notes (Signed)
? ?Follow-Up Visit ?  ?Subjective  ?Paul Marquez is a 81 y.o. male who presents for the following: Scaly lesions (On the scalp - patient is concerned and would like his scalp checked and treated today. He has a hx of SCC. ). The patient has spots, moles and lesions to be evaluated, some may be new or changing. ? ?The following portions of the chart were reviewed this encounter and updated as appropriate:  ? Tobacco  Allergies  Meds  Problems  Med Hx  Surg Hx  Fam Hx   ?  ?Review of Systems:  No other skin or systemic complaints except as noted in HPI or Assessment and Plan. ? ?Objective  ?Well appearing patient in no apparent distress; mood and affect are within normal limits. ? ?A focused examination was performed including the face, ears, arms and scalp. Relevant physical exam findings are noted in the Assessment and Plan. ? ?Scalp x 15 (15) ?Erythematous thin papules/macules with gritty scale.  ? ?Face ?Mid face erythema with telangiectasias +/- scattered inflammatory papules. With thickening of the skin on the nose.  ? ? 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 ?Destruction of lesion - Scalp x 15 ?Complexity: simple   ?Destruction method: cryotherapy   ?Informed consent: discussed and consent obtained   ?Timeout:  patient name, date of birth, surgical site, and procedure verified ?Lesion destroyed using liquid nitrogen: Yes   ?Region frozen until ice ball extended beyond lesion: Yes   ?Outcome: patient tolerated procedure well with no complications   ?Post-procedure details: wound care instructions given   ? 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 ?Rosacea is a chronic progressive skin condition usually affecting the face of adults, causing redness and/or acne bumps. It is treatable but not curable. It sometimes affects the eyes (ocular rosacea) as well. It may respond to topical and/or systemic medication and can flare with stress, sun  exposure, alcohol, exercise and some foods.  Daily application of broad spectrum spf 30+ sunscreen to face is recommended to reduce flares. ? ?Actinic Damage ?- chronic, secondary to cumulative UV radiation exposure/sun exposure over time ?- diffuse scaly erythematous macules with underlying dyspigmentation ?- Recommend daily broad spectrum sunscreen SPF 30+ to sun-exposed areas, reapply every 2 hours as needed.  ?- Recommend staying in the shade or wearing long sleeves, sun glasses (UVA+UVB protection) and wide brim hats (4-inch brim around the entire circumference of the hat). ?- Call for new or changing lesions. ? ?Seborrheic Keratoses ?- Stuck-on, waxy, tan-brown papules and/or plaques  ?- Benign-appearing ?- Discussed benign etiology and prognosis. ?- Observe ?- Call for any changes ? ?History of Squamous Cell Carcinoma of the Skin ?- No evidence of recurrence today ?- No lymphadenopathy ?- Recommend regular full body skin exams ?- Recommend daily broad spectrum sunscreen SPF 30+ to sun-exposed areas, reapply every 2 hours as needed.  ?- Call if any new or changing lesions are noted between office visits ? ?Purpura - Chronic; persistent and recurrent.  Treatable, but not curable. ?- Violaceous macules and patches ?- Benign ?- Related to trauma, age, sun damage and/or use of blood thinners, chronic use of topical and/or oral steroids ?- Observe ?- Can use OTC arnica containing moisturizer such as Dermend Bruise Formula if desired ?- Call for worsening or other concerns ? ?Return in about 4 months (around 11/20/2021) for AK f/u . ? ?IRudell Cobb, CMA, am acting as scribe for [employer]  Nehemiah Massed, MD . ?Documentation: I have reviewed the above documentation for accuracy and completeness, and I agree with the above. ? ?Sarina Ser, MD ? ? ?

## 2021-07-29 ENCOUNTER — Encounter: Payer: Self-pay | Admitting: Dermatology

## 2021-08-15 ENCOUNTER — Other Ambulatory Visit: Payer: Self-pay | Admitting: Family Medicine

## 2021-08-15 DIAGNOSIS — M5412 Radiculopathy, cervical region: Secondary | ICD-10-CM

## 2021-09-03 ENCOUNTER — Ambulatory Visit
Admission: RE | Admit: 2021-09-03 | Discharge: 2021-09-03 | Disposition: A | Discharge status: Home / Self Care | Payer: Medicare HMO | Source: Ambulatory Visit | Attending: Family Medicine | Admitting: Family Medicine

## 2021-09-03 DIAGNOSIS — M5412 Radiculopathy, cervical region: Secondary | ICD-10-CM

## 2021-11-26 ENCOUNTER — Ambulatory Visit: Payer: Medicare HMO | Admitting: Dermatology

## 2021-11-26 DIAGNOSIS — Z79899 Other long term (current) drug therapy: Secondary | ICD-10-CM | POA: Diagnosis not present

## 2021-11-26 DIAGNOSIS — L578 Other skin changes due to chronic exposure to nonionizing radiation: Secondary | ICD-10-CM | POA: Diagnosis not present

## 2021-11-26 DIAGNOSIS — L570 Actinic keratosis: Secondary | ICD-10-CM

## 2021-11-26 NOTE — Patient Instructions (Addendum)
Instructions for [facility] Medications  One or more of your medications was sent to the [facility] mail order compounding pharmacy. You will receive an email from them and can purchase the medicine through that link. It will then be mailed to your home at the address you confirmed. If for any reason you do not receive an email from them, please check your spam folder. If you still do not find the email, please let us know. [facility] phone number is [phone].    5-Fluorouracil/Calcipotriene Patient Education  Start 5FU/Calcipotriene cream apply to scalp twice a day x 7 days   Actinic keratoses are the dry, red scaly spots on the skin caused by sun damage. A portion of these spots can turn into skin cancer with time, and treating them can help prevent development of skin cancer.   Treatment of these spots requires removal of the defective skin cells. There are various ways to remove actinic keratoses, including freezing with liquid nitrogen, treatment with creams, or treatment with a blue light procedure in the office.   5-fluorouracil cream is a topical cream used to treat actinic keratoses. It works by interfering with the growth of abnormal fast-growing skin cells, such as actinic keratoses. These cells peel off and are replaced by healthy ones.   5-fluorouracil/calcipotriene is a combination of the 5-fluorouracil cream with a vitamin D analog cream called calcipotriene. The calcipotriene alone does not treat actinic keratoses. However, when it is combined with 5-fluorouracil, it helps the 5-fluorouracil treat the actinic keratoses much faster so that the same results can be achieved with a much shorter treatment time.  INSTRUCTIONS FOR 5-FLUOROURACIL/CALCIPOTRIENE CREAM:   5-fluorouracil/calcipotriene cream typically only needs to be used for 4-7 days. A thin layer should be applied twice a day to the treatment areas recommended by your physician.   If your physician  prescribed you separate tubes of 5-fluourouracil and calcipotriene, apply a thin layer of 5-fluorouracil followed by a thin layer of calcipotriene.   Avoid contact with your eyes, nostrils, and mouth. Do not use 5-fluorouracil/calcipotriene cream on infected or open wounds.   You will develop redness, irritation and some crusting at areas where you have pre-cancer damage/actinic keratoses. IF YOU DEVELOP PAIN, BLEEDING, OR SIGNIFICANT CRUSTING, STOP THE TREATMENT EARLY - you have already gotten a good response and the actinic keratoses should clear up well.  Wash your hands after applying 5-fluorouracil 5% cream on your skin.   A moisturizer or sunscreen with a minimum SPF 30 should be applied each morning.   Once you have finished the treatment, you can apply a thin layer of Vaseline twice a day to irritated areas to soothe and calm the areas more quickly. If you experience significant discomfort, contact your physician.  For some patients it is necessary to repeat the treatment for best results.  SIDE EFFECTS: When using 5-fluorouracil/calcipotriene cream, you may have mild irritation, such as redness, dryness, swelling, or a mild burning sensation. This usually resolves within 2 weeks. The more actinic keratoses you have, the more redness and inflammation you can expect during treatment. Eye irritation has been reported rarely. If this occurs, please let us know.  If you have any trouble using this cream, please call the office. If you have any other questions about this information, please do not hesitate to ask me before you leave the office.        Cryotherapy Aftercare  Wash gently with soap and water everyday.   Apply Vaseline and Band-Aid  daily until healed.    Due to recent changes in healthcare laws, you may see results of your pathology and/or laboratory studies on MyChart before the doctors have had a chance to review them. We understand that in some cases there may be  results that are confusing or concerning to you. Please understand that not all results are received at the same time and often the doctors may need to interpret multiple results in order to provide you with the best plan of care or course of treatment. Therefore, we ask that you please give [place] 2 business days to thoroughly review all your results before contacting the office for clarification. Should we see a critical lab result, you will be contacted sooner.   If You Need Anything After Your Visit  If you have any questions or concerns for your doctor, please call our main line at [phone] and press option 4 to reach your doctor's medical assistant. If no one answers, please leave a voicemail as directed and we will return your call as soon as possible. Messages left after 4 pm will be answered the following business day.   You may also send [place] a message via [place]. We typically respond to MyChart messages within 1-2 business days.  For prescription refills, please ask your pharmacy to contact our office. Our fax number is [phone].  If you have an urgent issue when the clinic is closed that cannot wait until the next business day, you can page your doctor at the number below.    Please note that while we do our best to be available for urgent issues outside of office hours, we are not available 24/7.   If you have an urgent issue and are unable to reach [place], you may choose to seek medical care at your doctor's office, retail clinic, urgent care center, or emergency room.  If you have a medical emergency, please immediately call 911 or go to the emergency department.  Pager Numbers  - Dr. Nehemiah Massed: [phone]  - Dr. Laurence Ferrari: [phone]  - Dr. Nicole Kindred: [phone]  In the event of inclement weather, please call our main line at [phone] for an update on the status of any delays or closures.  Dermatology Medication Tips: Please keep the boxes that topical medications come  in in order to help keep track of the instructions about where and how to use these. Pharmacies typically print the medication instructions only on the boxes and not directly on the medication tubes.   If your medication is too expensive, please contact our office at [phone] option 4 or send [place] a message through [place].   We are unable to tell what your co-pay for medications will be in advance as this is different depending on your insurance coverage. However, we may be able to find a substitute medication at lower cost or fill out paperwork to get insurance to cover a needed medication.   If a prior authorization is required to get your medication covered by your insurance company, please allow [place] 1-2 business days to complete this process.  Drug prices often vary depending on where the prescription is filled and some pharmacies may offer cheaper prices.  The website www.goodrx.com contains coupons for medications through different pharmacies. The prices here do not account for what the cost may be with help from insurance (it may be cheaper with your insurance), but the website can give you the price if you did not use any insurance.  - You can print  the associated coupon and take it with your prescription to the pharmacy.  - You may also stop by our office during regular business hours and pick up a GoodRx coupon card.  - If you need your prescription sent electronically to a different pharmacy, notify our office through [facility] or by phone at [phone] option 4.     Si Usted Necesita Algo Despus de Su Visita  Tambin puede enviarnos un mensaje a travs de Pharmacist, community. Por lo general respondemos a los mensajes de MyChart en el transcurso de 1 a 2 das hbiles.  Para renovar recetas, por favor pida a su farmacia que se ponga en contacto con nuestra oficina. Harland Dingwall de fax es [place] [phone].  Si tiene un asunto urgente cuando la clnica est cerrada y que no puede  esperar hasta el siguiente da hbil, puede llamar/localizar a su doctor(a) al nmero que aparece a continuacin.   Por favor, tenga en cuenta que aunque hacemos todo lo posible para estar disponibles para asuntos urgentes fuera del horario de [place], no estamos disponibles las 24 horas del da, los 7 das de la [place].   Si tiene un problema urgente y no puede comunicarse con nosotros, puede optar por buscar atencin mdica  en el consultorio de su doctor(a), en una clnica privada, en un centro de atencin urgente o en una sala de emergencias.  Si tiene Engineering geologist, por favor llame inmediatamente al 911 o vaya a la sala de emergencias.  Nmeros de bper  - Dr. Nehemiah Massed: [phone]  - Dra. Moye: [phone]  - Dra. Nicole Kindred: [phone]  En caso de inclemencias del [place], por favor llame a Johnsie Kindred principal al [phone] para una actualizacin sobre el [place] de cualquier retraso o cierre.  Consejos para la medicacin en dermatologa: Por favor, guarde las cajas en las que vienen los medicamentos de uso tpico para ayudarle a seguir las instrucciones sobre dnde y cmo usarlos. Las farmacias generalmente imprimen las instrucciones del medicamento slo en las cajas y no directamente en los tubos del [place].   Si su medicamento es muy caro, por favor, pngase en contacto con Zigmund Daniel llamando al [phone] y presione la opcin 4 o envenos un mensaje a travs de Pharmacist, community.   No podemos decirle cul ser su copago por los medicamentos por adelantado ya que esto es diferente dependiendo de la cobertura de su seguro. Sin embargo, es posible que podamos encontrar un medicamento sustituto a Electrical engineer un formulario para que el seguro cubra el medicamento que se considera necesario.   Si se requiere una autorizacin previa para que su compaa de seguros [place] su medicamento, por favor permtanos de 1 a 2 das hbiles para completar este proceso.  Los  precios de los medicamentos varan con frecuencia dependiendo del Environmental consultant de dnde se surte la receta y alguna farmacias pueden ofrecer precios ms baratos.  El sitio web www.goodrx.com tiene cupones para medicamentos de Airline pilot. Los precios aqu no tienen en cuenta lo que podra costar con la ayuda del seguro (puede ser ms barato con su seguro), pero el sitio web puede darle el precio si no utiliz Research scientist (physical sciences).  - Puede imprimir el cupn correspondiente y llevarlo con su receta a la farmacia.  - Tambin puede pasar por nuestra oficina durante el horario de atencin regular y Charity fundraiser una tarjeta de cupones de GoodRx.  - Si necesita que su receta se enve electrnicamente a Chiropodist, informe a nuestra oficina  a travs de MyChart [place] o por telfono llamando al [phone] y presione la opcin 4.

## 2021-11-26 NOTE — Progress Notes (Signed)
Follow-Up Visit   Subjective  Paul Marquez is a 81 y.o. male who presents for the following: Actinic Keratosis (4 months f/u hx of precancers on the face and scalp). The patient has spots, moles and lesions to be evaluated, some may be new or changing and the patient has concerns that these could be cancer.  The following portions of the chart were reviewed this encounter and updated as appropriate:   Tobacco  Allergies  Meds  Problems  Med Hx  Surg Hx  Fam Hx     Review of Systems:  No other skin or systemic complaints except as noted in HPI or Assessment and Plan.  Objective  Well appearing patient in no apparent distress; mood and affect are within normal limits.  A focused examination was performed including scalp,face. Relevant physical exam findings are noted in the Assessment and Plan.  Scalp x 5 (5) Erythematous thin papules/macules with gritty scale.    Assessment & Plan  AK (actinic keratosis) (5) Scalp x 5  Actinic keratoses are precancerous spots that appear secondary to cumulative UV radiation exposure/sun exposure over time. They are chronic with expected duration over 1 year. A portion of actinic keratoses will progress to squamous cell carcinoma of the skin. It is not possible to reliably predict which spots will progress to skin cancer and so treatment is recommended to prevent development of skin cancer.  Recommend daily broad spectrum sunscreen SPF 30+ to sun-exposed areas, reapply every 2 hours as needed.  Recommend staying in the shade or wearing long sleeves, sun glasses (UVA+UVB protection) and wide brim hats (4-inch brim around the entire circumference of the hat). Call for new or changing lesions.   Destruction of lesion - Scalp x 5 Complexity: simple   Destruction method: cryotherapy   Informed consent: discussed and consent obtained   Timeout:  patient name, date of birth, surgical site, and procedure verified Lesion destroyed using liquid  nitrogen: Yes   Region frozen until ice ball extended beyond lesion: Yes   Outcome: patient tolerated procedure well with no complications   Post-procedure details: wound care instructions given     Actinic Damage - Severe, confluent actinic changes with pre-cancerous actinic keratoses  - Severe, chronic, not at goal, secondary to cumulative UV radiation exposure over time - diffuse scaly erythematous macules and papules with underlying dyspigmentation - Discussed Prescription "Field Treatment" for Severe, Chronic Confluent Actinic Changes with Pre-Cancerous Actinic Keratoses  Start 5FU/Calcipotriene cream apply to scalp twice a day x 7 days   Field treatment involves treatment of an entire area of skin that has confluent Actinic Changes (Sun/ Ultraviolet light damage) and PreCancerous Actinic Keratoses by method of PhotoDynamic Therapy (PDT) and/or prescription Topical Chemotherapy agents such as 5-fluorouracil, 5-fluorouracil/calcipotriene, and/or imiquimod.  The purpose is to decrease the number of clinically evident and subclinical PreCancerous lesions to prevent progression to development of skin cancer by chemically destroying early precancer changes that may or may not be visible.  It has been shown to reduce the risk of developing skin cancer in the treated area. As a result of treatment, redness, scaling, crusting, and open sores may occur during treatment course. One or more than one of these methods may be used and may have to be used several times to control, suppress and eliminate the PreCancerous changes. Discussed treatment course, expected reaction, and possible side effects. - Recommend daily broad spectrum sunscreen SPF 30+ to sun-exposed areas, reapply every 2 hours as needed.  - Staying  in the shade or wearing long sleeves, sun glasses (UVA+UVB protection) and wide brim hats (4-inch brim around the entire circumference of the hat) are also recommended. - Call for new or changing  lesions.   Return in about 6 months (around 05/29/2022) for Aks .  IMarye Round, CMA, am acting as scribe for Sarina Ser, MD .  Documentation: I have reviewed the above documentation for accuracy and completeness, and I agree with the above.  Sarina Ser, MD

## 2021-11-29 ENCOUNTER — Encounter: Payer: Self-pay | Admitting: Dermatology

## 2022-06-10 ENCOUNTER — Ambulatory Visit: Payer: Medicare HMO | Admitting: Dermatology

## 2022-06-10 VITALS — BP 169/118 | HR 71

## 2022-06-10 DIAGNOSIS — Z79899 Other long term (current) drug therapy: Secondary | ICD-10-CM

## 2022-06-10 DIAGNOSIS — Z5111 Encounter for antineoplastic chemotherapy: Secondary | ICD-10-CM | POA: Diagnosis not present

## 2022-06-10 DIAGNOSIS — L905 Scar conditions and fibrosis of skin: Secondary | ICD-10-CM | POA: Diagnosis not present

## 2022-06-10 DIAGNOSIS — L570 Actinic keratosis: Secondary | ICD-10-CM | POA: Diagnosis not present

## 2022-06-10 DIAGNOSIS — L578 Other skin changes due to chronic exposure to nonionizing radiation: Secondary | ICD-10-CM

## 2022-06-10 NOTE — Progress Notes (Signed)
Follow-Up Visit   Subjective  Paul Marquez is a 82 y.o. male who presents for the following: Follow-up (6 months f/u on precancers on his scalp ). The patient has spots, moles and lesions to be evaluated, some may be new or changing and the patient has concerns that these could be cancer.  The following portions of the chart were reviewed this encounter and updated as appropriate:   Tobacco  Allergies  Meds  Problems  Med Hx  Surg Hx  Fam Hx     Review of Systems:  No other skin or systemic complaints except as noted in HPI or Assessment and Plan.  Objective  Well appearing patient in no apparent distress; mood and affect are within normal limits.  A focused examination was performed including face,scalp, left leg. Relevant physical exam findings are noted in the Assessment and Plan.  Scalp, face (6) Erythematous thin papules/macules with gritty scale.   left pretibial Flesh papule with a crust    Assessment & Plan  AK (actinic keratosis) (6) Scalp, face Actinic keratoses are precancerous spots that appear secondary to cumulative UV radiation exposure/sun exposure over time. They are chronic with expected duration over 1 year. A portion of actinic keratoses will progress to squamous cell carcinoma of the skin. It is not possible to reliably predict which spots will progress to skin cancer and so treatment is recommended to prevent development of skin cancer.  Recommend daily broad spectrum sunscreen SPF 30+ to sun-exposed areas, reapply every 2 hours as needed.  Recommend staying in the shade or wearing long sleeves, sun glasses (UVA+UVB protection) and wide brim hats (4-inch brim around the entire circumference of the hat). Call for new or changing lesions.   Destruction of lesion - Scalp, face Complexity: simple   Destruction method: cryotherapy   Informed consent: discussed and consent obtained   Timeout:  patient name, date of birth, surgical site, and procedure  verified Lesion destroyed using liquid nitrogen: Yes   Region frozen until ice ball extended beyond lesion: Yes   Outcome: patient tolerated procedure well with no complications   Post-procedure details: wound care instructions given    Scar left pretibial Suspect scar with traumatic injury  Observe   Actinic Damage with PreCancerous Actinic Keratoses Counseling for Topical Chemotherapy Management: Patient exhibits: - Severe, confluent actinic changes with pre-cancerous actinic keratoses that is secondary to cumulative UV radiation exposure over time - Condition that is severe; chronic, not at goal. - diffuse scaly erythematous macules and papules with underlying dyspigmentation - Discussed Prescription "Field Treatment" topical Chemotherapy for Severe, Chronic Confluent Actinic Changes with Pre-Cancerous Actinic Keratoses  Recommend red light with debridement on the scalp   Field treatment involves treatment of an entire area of skin that has confluent Actinic Changes (Sun/ Ultraviolet light damage) and PreCancerous Actinic Keratoses by method of PhotoDynamic Therapy (PDT) and/or prescription Topical Chemotherapy agents such as 5-fluorouracil, 5-fluorouracil/calcipotriene, and/or imiquimod.  The purpose is to decrease the number of clinically evident and subclinical PreCancerous lesions to prevent progression to development of skin cancer by chemically destroying early precancer changes that may or may not be visible.  It has been shown to reduce the risk of developing skin cancer in the treated area. As a result of treatment, redness, scaling, crusting, and open sores may occur during treatment course. One or more than one of these methods may be used and may have to be used several times to control, suppress and eliminate the PreCancerous changes. Discussed  treatment course, expected reaction, and possible side effects. - Recommend daily broad spectrum sunscreen SPF 30+ to sun-exposed areas,  reapply every 2 hours as needed.  - Staying in the shade or wearing long sleeves, sun glasses (UVA+UVB protection) and wide brim hats (4-inch brim around the entire circumference of the hat) are also recommended. - Call for new or changing lesions.   Return in about 6 weeks (around 07/22/2022) for red light with debridement-scalp and Ak recheck in 8 months .  IMarye Round, CMA, am acting as scribe for Sarina Ser, MD .  Documentation: I have reviewed the above documentation for accuracy and completeness, and I agree with the above.  Sarina Ser, MD

## 2022-06-10 NOTE — Patient Instructions (Addendum)
 Cryotherapy Aftercare  Wash gently with soap and water everyday.   Apply Vaseline and Band-Aid daily until healed. Due to recent changes in healthcare laws, you may see results of your pathology and/or laboratory studies on MyChart before the doctors have had a chance to review them. We understand that in some cases there may be results that are confusing or concerning to you. Please understand that not all results are received at the same time and often the doctors may need to interpret multiple results in order to provide you with the best plan of care or course of treatment. Therefore, we ask that you please give us 2 business days to thoroughly review all your results before contacting the office for clarification. Should we see a critical lab result, you will be contacted sooner.   If You Need Anything After Your Visit  If you have any questions or concerns for your doctor, please call our main line at [phone] and press option 4 to reach your doctor's medical assistant. If no one answers, please leave a voicemail as directed and we will return your call as soon as possible. Messages left after 4 pm will be answered the following business day.   You may also send us a message via MyChart. We typically respond to MyChart messages within 1-2 business days.  For prescription refills, please ask your pharmacy to contact our office. Our fax number is [phone].  If you have an urgent issue when the clinic is closed that cannot wait until the next business day, you can page your doctor at the number below.    Please note that while we do our best to be available for urgent issues outside of office hours, we are not available 24/7.   If you have an urgent issue and are unable to reach us, you may choose to seek medical care at your doctor's office, retail clinic, urgent care center, or emergency room.  If you have a medical emergency, please immediately call 911 or go to the emergency  department.  Pager Numbers  - Dr. Kowalski: [phone]  - Dr. Moye: [phone]  - Dr. Stewart: [phone]  In the event of inclement weather, please call our main line at [phone] for an update on the status of any delays or closures.  Dermatology Medication Tips: Please keep the boxes that topical medications come in in order to help keep track of the instructions about where and how to use these. Pharmacies typically print the medication instructions only on the boxes and not directly on the medication tubes.   If your medication is too expensive, please contact our office at [phone] option 4 or send us a message through MyChart.   We are unable to tell what your co-pay for medications will be in advance as this is different depending on your insurance coverage. However, we may be able to find a substitute medication at lower cost or fill out paperwork to get insurance to cover a needed medication.   If a prior authorization is required to get your medication covered by your insurance company, please allow us 1-2 business days to complete this process.  Drug prices often vary depending on where the prescription is filled and some pharmacies may offer cheaper prices.  The website www.goodrx.com contains coupons for medications through different pharmacies. The prices here do not account for what the cost may be with help from insurance (it may be cheaper with your insurance), but the website can give you the   price if you did not use any insurance.  - You can print the associated coupon and take it with your prescription to the pharmacy.  - You may also stop by our office during regular business hours and pick up a GoodRx coupon card.  - If you need your prescription sent electronically to a different pharmacy, notify our office through [place] MyChart or by phone at [phone] option 4.     Si Usted Necesita Algo Despus de Su Visita  Tambin puede enviarnos un  mensaje a travs de MyChart. Por lo general respondemos a los mensajes de MyChart en el transcurso de 1 a 2 das hbiles.  Para renovar recetas, por favor pida a su farmacia que se ponga en contacto con nuestra oficina. Nuestro nmero de fax es el [phone].  Si tiene un asunto urgente cuando la clnica est cerrada y que no puede esperar hasta el siguiente da hbil, puede llamar/localizar a su doctor(a) al nmero que aparece a continuacin.   Por favor, tenga en cuenta que aunque hacemos todo lo posible para estar disponibles para asuntos urgentes fuera del horario de oficina, no estamos disponibles las 24 horas del da, los 7 das de la semana.   Si tiene un problema urgente y no puede comunicarse con nosotros, puede optar por buscar atencin mdica  en el consultorio de su doctor(a), en una clnica privada, en un centro de atencin urgente o en una sala de emergencias.  Si tiene una emergencia mdica, por favor llame inmediatamente al 911 o vaya a la sala de emergencias.  Nmeros de bper  - Dr. Kowalski: [phone]  - Dra. Moye: [phone]  - Dra. Stewart: [phone]  En caso de inclemencias del tiempo, por favor llame a nuestra lnea principal al [phone] para una actualizacin sobre el estado de cualquier retraso o cierre.  Consejos para la medicacin en dermatologa: Por favor, guarde las cajas en las que vienen los medicamentos de uso tpico para ayudarle a seguir las instrucciones sobre dnde y cmo usarlos. Las farmacias generalmente imprimen las instrucciones del medicamento slo en las cajas y no directamente en los tubos del medicamento.   Si su medicamento es muy caro, por favor, pngase en contacto con nuestra oficina llamando al [phone] y presione la opcin 4 o envenos un mensaje a travs de MyChart.   No podemos decirle cul ser su copago por los medicamentos por adelantado ya que esto es diferente dependiendo de la cobertura de su seguro. Sin embargo,  es posible que podamos encontrar un medicamento sustituto a menor costo o llenar un formulario para que el seguro cubra el medicamento que se considera necesario.   Si se requiere una autorizacin previa para que su compaa de seguros cubra su medicamento, por favor permtanos de 1 a 2 das hbiles para completar este proceso.  Los precios de los medicamentos varan con frecuencia dependiendo del lugar de dnde se surte la receta y alguna farmacias pueden ofrecer precios ms baratos.  El sitio web www.goodrx.com tiene cupones para medicamentos de diferentes farmacias. Los precios aqu no tienen en cuenta lo que podra costar con la ayuda del seguro (puede ser ms barato con su seguro), pero el sitio web puede darle el precio si no utiliz ningn seguro.  - Puede imprimir el cupn correspondiente y llevarlo con su receta a la farmacia.  - Tambin puede pasar por nuestra oficina durante el horario de atencin regular y recoger una tarjeta de cupones de GoodRx.  - Si necesita que   su receta se enve electrnicamente a una farmacia diferente, informe a nuestra oficina a travs de MyChart de [place] o por telfono llamando al [phone] y presione la opcin 4.  

## 2022-06-14 ENCOUNTER — Encounter: Payer: Self-pay | Admitting: Dermatology

## 2022-07-22 ENCOUNTER — Ambulatory Visit: Payer: Medicare HMO

## 2022-10-02 ENCOUNTER — Emergency Department: Payer: Medicare HMO

## 2022-10-02 ENCOUNTER — Emergency Department
Admission: EM | Admit: 2022-10-02 | Discharge: 2022-10-03 | Disposition: A | Discharge status: Home / Self Care | Payer: Medicare HMO | Attending: Emergency Medicine | Admitting: Emergency Medicine

## 2022-10-02 ENCOUNTER — Other Ambulatory Visit: Payer: Self-pay

## 2022-10-02 DIAGNOSIS — R479 Unspecified speech disturbances: Secondary | ICD-10-CM | POA: Insufficient documentation

## 2022-10-02 DIAGNOSIS — R410 Disorientation, unspecified: Secondary | ICD-10-CM | POA: Insufficient documentation

## 2022-10-02 LAB — COMPREHENSIVE METABOLIC PANEL
ALT: 28 U/L (ref 0–44)
AST: 23 U/L (ref 15–41)
Albumin: 4.5 g/dL (ref 3.5–5.0)
Alkaline Phosphatase: 74 U/L (ref 38–126)
Anion gap: 10 (ref 5–15)
BUN: 21 mg/dL (ref 8–23)
CO2: 21 mmol/L — ABNORMAL LOW (ref 22–32)
Calcium: 8.8 mg/dL — ABNORMAL LOW (ref 8.9–10.3)
Chloride: 103 mmol/L (ref 98–111)
Creatinine, Ser: 1.01 mg/dL (ref 0.61–1.24)
GFR, Estimated: >60 mL/min (ref >60)
Glucose, Bld: 118 mg/dL — ABNORMAL HIGH (ref 70–99)
Potassium: 3.8 mmol/L (ref 3.5–5.1)
Sodium: 134 mmol/L — ABNORMAL LOW (ref 135–145)
Total Bilirubin: 1.3 mg/dL — ABNORMAL HIGH (ref 0.3–1.2)
Total Protein: 7.3 g/dL (ref 6.5–8.1)

## 2022-10-02 LAB — URINALYSIS, COMPLETE (UACMP) WITH MICROSCOPIC
Bilirubin Urine: NEGATIVE
Glucose, UA: NEGATIVE mg/dL
Hgb urine dipstick: NEGATIVE
Ketones, ur: 5 mg/dL — AB
Leukocytes,Ua: NEGATIVE
Nitrite: NEGATIVE
Protein, ur: 30 mg/dL — AB
Specific Gravity, Urine: 1.023 (ref 1.005–1.030)
pH: 6 (ref 5.0–8.0)

## 2022-10-02 LAB — CBC
HCT: 37.7 % — ABNORMAL LOW (ref 39.0–52.0)
Hemoglobin: 12.5 g/dL — ABNORMAL LOW (ref 13.0–17.0)
MCH: 30.5 pg (ref 26.0–34.0)
MCHC: 33.2 g/dL (ref 30.0–36.0)
MCV: 92 fL (ref 80.0–100.0)
Platelets: 134 10*3/uL — ABNORMAL LOW (ref 150–400)
RBC: 4.1 MIL/uL — ABNORMAL LOW (ref 4.22–5.81)
RDW: 16.3 % — ABNORMAL HIGH (ref 11.5–15.5)
WBC: 4.9 10*3/uL (ref 4.0–10.5)
nRBC: 0 % (ref 0.0–0.2)

## 2022-10-02 MED ORDER — LORAZEPAM 1 MG PO TABS
1.0000 mg | ORAL_TABLET | Freq: Once | ORAL | Status: AC
  Administered 2022-10-02: 1 mg via ORAL
  Filled 2022-10-02: qty 1

## 2022-10-02 NOTE — ED Provider Notes (Signed)
[facility] Provider Note    Event Date/Time   First MD Initiated Contact with Patient 10/02/22 2052     (approximate)   History   Altered Mental Status   HPI  Paul Marquez is a 82 y.o. male who presents to the emergency department today because of concerns with difficulty with speech, confusion and balance issues.  The patient states that the symptoms started a few days ago with the balance issue.  He then started having the speech difficulty today.  No associated headache.  Patient denies similar symptoms in the past.  Denies any fevers.  Does state that he noted his urine was darker than normal.     Physical Exam   Triage Vital Signs: ED Triage Vitals  Enc Vitals Group     BP 10/02/22 1843 137/78     Pulse Rate 10/02/22 1843 91     Resp 10/02/22 1843 16     Temp 10/02/22 1843 (!) 97.5 F (36.4 C)     Temp Source 10/02/22 1843 Oral     SpO2 10/02/22 1843 98 %     Weight --      Height --      Head Circumference --      Peak Flow --      Pain Score 10/02/22 1841 3     Pain Loc --      Pain Edu? --      Excl. in GC? --     Most recent vital signs: Vitals:   10/02/22 1843  BP: 137/78  Pulse: 91  Resp: 16  Temp: (!) 97.5 F (36.4 C)  SpO2: 98%   General: Awake, alert, oriented. CV:  Good peripheral perfusion. Regular rate and rhythm. Resp:  Normal effort. Lungs clear. Abd:  No distention.    ED Results / Procedures / Treatments   Labs (all labs ordered are listed, but only abnormal results are displayed) Labs Reviewed  COMPREHENSIVE METABOLIC PANEL - Abnormal; Notable for the following components:      Result Value   Sodium 134 (*)    CO2 21 (*)    Glucose, Bld 118 (*)    Calcium 8.8 (*)    Total Bilirubin 1.3 (*)    All other components within normal limits  CBC - Abnormal; Notable for the following components:   RBC 4.10 (*)    Hemoglobin 12.5 (*)    HCT 37.7 (*)    RDW 16.3 (*)    Platelets 134 (*)    All other  components within normal limits  CBG MONITORING, ED     EKG  I, Phineas Semen, attending physician, personally viewed and interpreted this EKG  EKG Time: 1848 Rate: 91 Rhythm: sinus rhythm with 1st degree av block Axis: normal Intervals: qtc 484 QRS: RBBB ST changes: no st elevation Impression: abnormal ekg   RADIOLOGY I independently interpreted and visualized the CT head/cervical spine. My interpretation: No bleed Radiology interpretation:  IMPRESSION:  CT of the head: No acute intracranial abnormality noted.    CT of the cervical spine: Degenerative change without acute  abnormality.   MR brain IMPRESSION:  1. No acute intracranial abnormality.  2. 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     PROCEDURES:  Critical Care performed: No    MEDICATIONS ORDERED IN ED: Medications - No data to  display   IMPRESSION / MDM / ASSESSMENT AND PLAN / ED COURSE  I reviewed the triage vital signs and the nursing notes.                              Differential diagnosis includes, but is not limited to, CVA, infection, electrolyte abnormality  Patient's presentation is most consistent with acute presentation with potential threat to life or bodily function.   The patient is on the cardiac monitor to evaluate for evidence of arrhythmia and/or significant heart rate changes.  Patient presented to the emergency department today with concerns for confusion and speech difficulty as well as some balance issues.  Did fall and did hit his head.  CT head and cervical spine without concerning abnormalities.  Blood work without significant electrolyte abnormality.  Did obtain an MRI which did not show any acute infarct.  I did discuss with this patient and family.  At this time somewhat unclear etiology of the patient's  symptoms however they felt comfortable with negative MRI to be discharged.  I think this is reasonable.  Will give neurology follow-up.  Discussed return precautions.      FINAL CLINICAL IMPRESSION(S) / ED DIAGNOSES   Final diagnoses:  Difficulty with speech      Note:  This document was prepared using Dragon voice recognition software and may include unintentional dictation errors.    Phineas Semen, MD 10/03/22 [phone]

## 2022-10-02 NOTE — ED Triage Notes (Signed)
Pt comes via [facility] with c/o AMs for two days. Pt did fall last week and hit his head. Pt states tingling in hand. Pt has also found several ticks on him as well. No fevers reported.   Pt also states dark urine and lower belly pain. No pain with urination.

## 2022-10-02 NOTE — ED Notes (Signed)
Pt transported to MRI 

## 2022-10-03 NOTE — Discharge Instructions (Signed)
Please seek medical attention for any high fevers, chest pain, shortness of breath, change in behavior, persistent vomiting, bloody stool or any other new or concerning symptoms.  

## 2022-10-04 LAB — URINE CULTURE: Culture: >=100000 — AB

## 2022-10-28 ENCOUNTER — Emergency Department
Admission: EM | Admit: 2022-10-28 | Discharge: 2022-10-28 | Disposition: A | Discharge status: Home / Self Care | Payer: Medicare HMO | Attending: Emergency Medicine | Admitting: Emergency Medicine

## 2022-10-28 ENCOUNTER — Emergency Department: Payer: Medicare HMO

## 2022-10-28 DIAGNOSIS — J449 Chronic obstructive pulmonary disease, unspecified: Secondary | ICD-10-CM | POA: Diagnosis not present

## 2022-10-28 DIAGNOSIS — R1031 Right lower quadrant pain: Secondary | ICD-10-CM | POA: Diagnosis present

## 2022-10-28 DIAGNOSIS — K5900 Constipation, unspecified: Secondary | ICD-10-CM | POA: Diagnosis not present

## 2022-10-28 DIAGNOSIS — I714 Abdominal aortic aneurysm, without rupture, unspecified: Secondary | ICD-10-CM

## 2022-10-28 DIAGNOSIS — N50811 Right testicular pain: Secondary | ICD-10-CM | POA: Diagnosis not present

## 2022-10-28 DIAGNOSIS — I10 Essential (primary) hypertension: Secondary | ICD-10-CM | POA: Insufficient documentation

## 2022-10-28 DIAGNOSIS — I2510 Atherosclerotic heart disease of native coronary artery without angina pectoris: Secondary | ICD-10-CM | POA: Insufficient documentation

## 2022-10-28 LAB — COMPREHENSIVE METABOLIC PANEL
ALT: 31 U/L (ref 0–44)
AST: 22 U/L (ref 15–41)
Albumin: 3.8 g/dL (ref 3.5–5.0)
Alkaline Phosphatase: 73 U/L (ref 38–126)
Anion gap: 7 (ref 5–15)
BUN: 20 mg/dL (ref 8–23)
CO2: 25 mmol/L (ref 22–32)
Calcium: 8.6 mg/dL — ABNORMAL LOW (ref 8.9–10.3)
Chloride: 103 mmol/L (ref 98–111)
Creatinine, Ser: 0.85 mg/dL (ref 0.61–1.24)
GFR, Estimated: >60 mL/min (ref >60)
Glucose, Bld: 104 mg/dL — ABNORMAL HIGH (ref 70–99)
Potassium: 4 mmol/L (ref 3.5–5.1)
Sodium: 135 mmol/L (ref 135–145)
Total Bilirubin: 1 mg/dL (ref 0.3–1.2)
Total Protein: 6.7 g/dL (ref 6.5–8.1)

## 2022-10-28 LAB — CBC
HCT: 36.5 % — ABNORMAL LOW (ref 39.0–52.0)
Hemoglobin: 12.2 g/dL — ABNORMAL LOW (ref 13.0–17.0)
MCH: 30.6 pg (ref 26.0–34.0)
MCHC: 33.4 g/dL (ref 30.0–36.0)
MCV: 91.5 fL (ref 80.0–100.0)
Platelets: 125 10*3/uL — ABNORMAL LOW (ref 150–400)
RBC: 3.99 MIL/uL — ABNORMAL LOW (ref 4.22–5.81)
RDW: 16.3 % — ABNORMAL HIGH (ref 11.5–15.5)
WBC: 6.8 10*3/uL (ref 4.0–10.5)
nRBC: 0 % (ref 0.0–0.2)

## 2022-10-28 LAB — URINALYSIS, ROUTINE W REFLEX MICROSCOPIC
Bilirubin Urine: NEGATIVE
Glucose, UA: NEGATIVE mg/dL
Hgb urine dipstick: NEGATIVE
Ketones, ur: NEGATIVE mg/dL
Leukocytes,Ua: NEGATIVE
Nitrite: NEGATIVE
Protein, ur: NEGATIVE mg/dL
Specific Gravity, Urine: 1.01 (ref 1.005–1.030)
pH: 6 (ref 5.0–8.0)

## 2022-10-28 LAB — LIPASE, BLOOD: Lipase: 32 U/L (ref 11–51)

## 2022-10-28 MED ORDER — IOHEXOL 300 MG/ML  SOLN
100.0000 mL | Freq: Once | INTRAMUSCULAR | Status: AC | PRN
  Administered 2022-10-28: 100 mL via INTRAVENOUS

## 2022-10-28 MED ORDER — DOCUSATE SODIUM 100 MG PO CAPS: 100.0000 mg | ORAL_CAPSULE | Freq: Two times a day (BID) | ORAL | 2 refills | Status: DC

## 2022-10-28 NOTE — ED Triage Notes (Signed)
Pt to ED for RLQ sharp pain and R testicle pain since 4 days. No pain/burning with urination. Pt does not know if has appendix.

## 2022-10-28 NOTE — Discharge Instructions (Signed)
You were seen in the emergency department today for constipation.  We recommend that you use one or more of the following over-the-counter medications in the order described:   1)  Colace (or Dulcolax) 100 mg:  This is a stool softener, and you may take it once or twice a day as needed. 2)  Senna tablets:  This is a bowel stimulant that will help "push" out your stool. It is the next step to add after you have tried a stool softener. 3)  Miralax (powder):  This medication works by drawing additional fluid into your intestines and helps to flush out your stool.  Mix the powder with water or juice according to label instructions.  It may help if the Colace and Senna are not sufficient, but you must be sure to use the recommended amount of water or juice when you mix up the powder. 4)  Look for magnesium citrate at the pharmacy (it is usually a small glass bottle).  Drink the bottle according to the label instructions.  Remember that narcotic pain medications are constipating, so avoid them or minimize their use.  Drink plenty of fluids.  Please return to the Emergency Department immediately if you develop new or worsening symptoms that concern you, such as (but not limited to) fever > 101 degrees, severe abdominal pain, or persistent vomiting.

## 2022-10-28 NOTE — ED Provider Notes (Signed)
[facility] Provider Note    Event Date/Time   First MD Initiated Contact with Patient 10/28/22 [phone]     (approximate)   History   Chief Complaint Abdominal Pain and Testicle Pain   HPI  Paul Marquez is a 82 y.o. male with past medical history of hypertension, CAD, and COPD who presents to the ED complaining of testicular pain.  Patient reports that he has had increasing pain in his right testicle over the past 4 days.  Describes the pain as sharp and radiating up into the right lower quadrant of his abdomen.  He has not noticed any redness or swelling to the area, states he has been able to urinate without difficulty.  He denies any dysuria, penile discharge, or penile lesions.  He has not had any fevers, nausea, vomiting, or diarrhea.     Physical Exam   Triage Vital Signs: ED Triage Vitals  Encounter Vitals Group     BP 10/28/22 0858 120/62     Systolic BP Percentile --      Diastolic BP Percentile --      Pulse Rate 10/28/22 0858 64     Resp 10/28/22 0858 16     Temp 10/28/22 0858 98 F (36.7 C)     Temp Source 10/28/22 0858 Oral     SpO2 10/28/22 0858 99 %     Weight 10/28/22 0858 170 lb (77.1 kg)     Height 10/28/22 0858 5\' 10"  (1.778 m)     Head Circumference --      Peak Flow --      Pain Score 10/28/22 0857 8     Pain Loc --      Pain Education --      Exclude from Growth Chart --     Most recent vital signs: Vitals:   10/28/22 0858  BP: 120/62  Pulse: 64  Resp: 16  Temp: 98 F (36.7 C)  SpO2: 99%    Constitutional: Alert and oriented. Eyes: Conjunctivae are normal. Head: Atraumatic. Nose: No congestion/rhinnorhea. Mouth/Throat: Mucous membranes are moist.  Cardiovascular: Normal rate, regular rhythm. Grossly normal heart sounds.  2+ radial pulses bilaterally. Respiratory: Normal respiratory effort.  No retractions. Lungs CTAB. Gastrointestinal: Soft and nontender. No distention. Genitourinary: Right testicular  tenderness to palpation noted, no associated erythema, edema, or warmth. Musculoskeletal: No lower extremity tenderness nor edema.  Neurologic:  Normal speech and language. No gross focal neurologic deficits are appreciated.    ED Results / Procedures / Treatments   Labs (all labs ordered are listed, but only abnormal results are displayed) Labs Reviewed  COMPREHENSIVE METABOLIC PANEL - Abnormal; Notable for the following components:      Result Value   Glucose, Bld 104 (*)    Calcium 8.6 (*)    All other components within normal limits  CBC - Abnormal; Notable for the following components:   RBC 3.99 (*)    Hemoglobin 12.2 (*)    HCT 36.5 (*)    RDW 16.3 (*)    Platelets 125 (*)    All other components within normal limits  URINALYSIS, ROUTINE W REFLEX MICROSCOPIC - Abnormal; Notable for the following components:   Color, Urine STRAW (*)    APPearance CLEAR (*)    All other components within normal limits  LIPASE, BLOOD   RADIOLOGY CT abdomen/pelvis reviewed and interpreted by me with no inflammatory changes, focal fluid collections, or dilated bowel loops.  PROCEDURES:  Critical Care performed:  No  Procedures   MEDICATIONS ORDERED IN ED: Medications  iohexol (OMNIPAQUE) 300 MG/ML solution 100 mL (100 mLs Intravenous Contrast Given 10/28/22 1253)     IMPRESSION / MDM / ASSESSMENT AND PLAN / ED COURSE  I reviewed the triage vital signs and the nursing notes.                              81 y.o. male with past medical history of hypertension, CAD, and COPD who presents to the ED complaining of increasing pain and sensitivity to touch of his right testicle over the past 4 days.  Patient's presentation is most consistent with acute presentation with potential threat to life or bodily function.  Differential diagnosis includes, but is not limited to, orchitis, epididymitis, inguinal hernia, UTI, cellulitis, abscess.  Patient nontoxic-appearing and in no acute  distress, vital signs are unremarkable.  On exam, his tenderness seems to be limited to his right testicle and there is no evidence of inguinal hernia.  No overlying erythema, warmth, or fluctuance to suggest cellulitis or abscess.  Will further assess with ultrasound, patient declines pain medication.  Labs are reassuring with no significant anemia, leukocytosis, tract abnormality, or AKI.  LFTs and lipase are also unremarkable, urinalysis pending at this time.  Urinalysis shows no signs of infection, scrotal ultrasound is unremarkable.  CT imaging was performed and shows constipation which could be the source of his pain.  He was also noted to have AAA at 3.3 x 3.4 cm but with no signs of rupture to suggest this as the source of his pain.  Radiology did note possible thin walled anterior aneurysm and findings were reviewed with Dr. Wyn Quaker of vascular surgery.  He recommends outpatient follow-up and will follow with ultrasound, no acute intervention needed at this time.  On reassessment, patient with minimal ongoing pain and is requesting to be discharged home.  He was counseled on over-the-counter regimen for constipation and counseled to follow-up with vascular surgery, otherwise return to the ED for new or worsening symptoms.  Patient agrees with plan.      FINAL CLINICAL IMPRESSION(S) / ED DIAGNOSES   Final diagnoses:  Right lower quadrant abdominal pain  Constipation, unspecified constipation type  Abdominal aortic aneurysm (AAA) without rupture, unspecified part (HCC)     Rx / DC Orders   ED Discharge Orders          Ordered    docusate sodium (COLACE) 100 MG capsule  2 times daily        10/28/22 1410             Note:  This document was prepared using Dragon voice recognition software and may include unintentional dictation errors.   Chesley Noon, MD 10/28/22 [phone]

## 2022-11-12 ENCOUNTER — Ambulatory Visit: Payer: Medicare HMO | Admitting: Dermatology

## 2022-11-12 DIAGNOSIS — L819 Disorder of pigmentation, unspecified: Secondary | ICD-10-CM

## 2022-11-12 DIAGNOSIS — W908XXA Exposure to other nonionizing radiation, initial encounter: Secondary | ICD-10-CM

## 2022-11-12 DIAGNOSIS — Z85828 Personal history of other malignant neoplasm of skin: Secondary | ICD-10-CM

## 2022-11-12 DIAGNOSIS — L818 Other specified disorders of pigmentation: Secondary | ICD-10-CM

## 2022-11-12 DIAGNOSIS — Z8589 Personal history of malignant neoplasm of other organs and systems: Secondary | ICD-10-CM

## 2022-11-12 DIAGNOSIS — D692 Other nonthrombocytopenic purpura: Secondary | ICD-10-CM

## 2022-11-12 DIAGNOSIS — L578 Other skin changes due to chronic exposure to nonionizing radiation: Secondary | ICD-10-CM | POA: Diagnosis not present

## 2022-11-12 DIAGNOSIS — L570 Actinic keratosis: Secondary | ICD-10-CM | POA: Diagnosis not present

## 2022-11-12 NOTE — Progress Notes (Signed)
Follow-Up Visit   Subjective  Paul Marquez is a 82 y.o. male who presents for the following: 5 months f/u on precancers on his scalp and face. The patient has spots, moles and lesions to be evaluated, some may be new or changing and the patient may have concern these could be cancer.  The following portions of the chart were reviewed this encounter and updated as appropriate: medications, allergies, medical history  Review of Systems:  No other skin or systemic complaints except as noted in HPI or Assessment and Plan.  Objective  Well appearing patient in no apparent distress; mood and affect are within normal limits. A focused examination was performed of the following areas:face,scalp Relevant exam findings are noted in the Assessment and Plan.  scalp, right temple x 5 (5) Erythematous thin papules/macules with gritty scale.     Assessment & Plan   AK (actinic keratosis) (5) scalp, right temple x 5  Actinic keratoses are precancerous spots that appear secondary to cumulative UV radiation exposure/sun exposure over time. They are chronic with expected duration over 1 year. A portion of actinic keratoses will progress to squamous cell carcinoma of the skin. It is not possible to reliably predict which spots will progress to skin cancer and so treatment is recommended to prevent development of skin cancer.  Recommend daily broad spectrum sunscreen SPF 30+ to sun-exposed areas, reapply every 2 hours as needed.  Recommend staying in the shade or wearing long sleeves, sun glasses (UVA+UVB protection) and wide brim hats (4-inch brim around the entire circumference of the hat). Call for new or changing lesions.   Destruction of lesion - scalp, right temple x 5 (5) Complexity: simple   Destruction method: cryotherapy   Informed consent: discussed and consent obtained   Timeout:  patient name, date of birth, surgical site, and procedure verified Lesion destroyed using liquid  nitrogen: Yes   Region frozen until ice ball extended beyond lesion: Yes   Outcome: patient tolerated procedure well with no complications   Post-procedure details: wound care instructions given    Actinic skin damage  Dyschromia  History of squamous cell carcinoma  Purpura (HCC)  Traumatic Tattoo Scalp From trauma years ago patient  drove off a bridge  ~60 years ago  Traumatic tattoo vs blue nevus - benign appearing, observe. Photo taken today  HISTORY OF SQUAMOUS CELL CARCINOMA OF THE SKIN - No evidence of recurrence today - No lymphadenopathy - Recommend regular full body skin exams - Recommend daily broad spectrum sunscreen SPF 30+ to sun-exposed areas, reapply every 2 hours as needed.  - Call if any new or changing lesions are noted between office visits   Purpura - Chronic; persistent and recurrent.  Treatable, but not curable. - Violaceous macules and patches - Benign - Related to trauma, age, sun damage and/or use of blood thinners, chronic use of topical and/or oral steroids - Observe - Can use OTC arnica containing moisturizer such as Dermend Bruise Formula if desired - Call for worsening or other concerns   ACTINIC DAMAGE - chronic, secondary to cumulative UV radiation exposure/sun exposure over time - diffuse scaly erythematous macules with underlying dyspigmentation - Recommend daily broad spectrum sunscreen SPF 30+ to sun-exposed areas, reapply every 2 hours as needed.  - Recommend staying in the shade or wearing long sleeves, sun glasses (UVA+UVB protection) and wide brim hats (4-inch brim around the entire circumference of the hat). - Call for new or changing lesions.  Return in about 1  year (around 11/12/2023) for UBSE, hx of SCC.  IAngelique Holm, CMA, am acting as scribe for Armida Sans, MD .   Documentation: I have reviewed the above documentation for accuracy and completeness, and I agree with the above.  Armida Sans, MD

## 2022-11-12 NOTE — Patient Instructions (Addendum)
Cryotherapy Aftercare  Wash gently with soap and water everyday.   Apply Vaseline and Band-Aid daily until healed.   Wound Care Instructions  Cleanse wound gently with soap and water once a day then pat dry with clean gauze. Apply a thin coat of Petrolatum (petroleum jelly, "Vaseline") over the wound (unless you have an allergy to this). We recommend that you use a new, sterile tube of Vaseline. Do not pick or remove scabs. Do not remove the yellow or white "healing tissue" from the base of the wound.  Cover the wound with fresh, clean, nonstick gauze and secure with paper tape. You may use Band-Aids in place of gauze and tape if the wound is small enough, but would recommend trimming much of the tape off as there is often too much. Sometimes Band-Aids can irritate the skin.  You should call the office for your biopsy report after 1 week if you have not already been contacted.  If you experience any problems, such as abnormal amounts of bleeding, swelling, significant bruising, significant pain, or evidence of infection, please call the office immediately.  FOR ADULT SURGERY PATIENTS: If you need something for pain relief you may take 1 extra strength Tylenol (acetaminophen) AND 2 Ibuprofen (200mg  each) together every 4 hours as needed for pain. (do not take these if you are allergic to them or if you have a reason you should not take them.) Typically, you may only need pain medication for 1 to 3 days.    Due to recent changes in healthcare laws, you may see results of your pathology and/or laboratory studies on MyChart before the doctors have had a chance to review them. We understand that in some cases there may be results that are confusing or concerning to you. Please understand that not all results are received at the same time and often the doctors may need to interpret multiple results in order to provide you with the best plan of care or course of treatment. Therefore, we ask that you  please give [place] 2 business days to thoroughly review all your results before contacting the office for clarification. Should we see a critical lab result, you will be contacted sooner.   If You Need Anything After Your Visit  If you have any questions or concerns for your doctor, please call our main line at [phone] and press option 4 to reach your doctor's medical assistant. If no one answers, please leave a voicemail as directed and we will return your call as soon as possible. Messages left after 4 pm will be answered the following business day.   You may also send [place] a message via MyChart. We typically respond to MyChart messages within 1-2 business days.  For prescription refills, please ask your pharmacy to contact our office. Our fax number is [phone].  If you have an urgent issue when the clinic is closed that cannot wait until the next business day, you can page your doctor at the number below.    Please note that while we do our best to be available for urgent issues outside of office hours, we are not available 24/7.   If you have an urgent issue and are unable to reach [place], you may choose to seek medical care at your doctor's office, retail clinic, urgent care center, or emergency room.  If you have a medical emergency, please immediately call 911 or go to the emergency department.  Pager Numbers  - Dr. Gwen Pounds: [phone]  - Dr.  Roseanne Reno: [phone]  In the event of inclement weather, please call our main line at [phone] for an update on the status of any delays or closures.  Dermatology Medication Tips: Please keep the boxes that topical medications come in in order to help keep track of the instructions about where and how to use these. Pharmacies typically print the medication instructions only on the boxes and not directly on the medication tubes.   If your medication is too expensive, please contact our office at [phone] option 4 or send [place] a  message through MyChart.   We are unable to tell what your co-pay for medications will be in advance as this is different depending on your insurance coverage. However, we may be able to find a substitute medication at lower cost or fill out paperwork to get insurance to cover a needed medication.   If a prior authorization is required to get your medication covered by your insurance company, please allow [place] 1-2 business days to complete this process.  Drug prices often vary depending on where the prescription is filled and some pharmacies may offer cheaper prices.  The website www.goodrx.com contains coupons for medications through different pharmacies. The prices here do not account for what the cost may be with help from insurance (it may be cheaper with your insurance), but the website can give you the price if you did not use any insurance.  - You can print the associated coupon and take it with your prescription to the pharmacy.  - You may also stop by our office during regular business hours and pick up a GoodRx coupon card.  - If you need your prescription sent electronically to a different pharmacy, notify our office through [facility] or by phone at [phone] option 4.     Si Usted Necesita Algo Despus de Su Visita  Tambin puede enviarnos un mensaje a travs de Clinical cytogeneticist. Por lo general respondemos a los mensajes de MyChart en el transcurso de 1 a 2 das hbiles.  Para renovar recetas, por favor pida a su farmacia que se ponga en contacto con nuestra oficina. Annie Sable de fax es [place] [phone].  Si tiene un asunto urgente cuando la clnica est cerrada y que no puede esperar hasta el siguiente da hbil, puede llamar/localizar a su doctor(a) al nmero que aparece a continuacin.   Por favor, tenga en cuenta que aunque hacemos todo lo posible para estar disponibles para asuntos urgentes fuera del horario de [place], no estamos disponibles las 24 horas del da, los 7  [address]  [address] de la [place].   Si tiene un problema urgente y no puede comunicarse con nosotros, puede optar por buscar atencin mdica  en el consultorio de su doctor(a), en una clnica privada, en un centro de atencin urgente o en una sala de emergencias.  Si tiene Engineer, drilling, por favor llame inmediatamente al 911 o vaya a la sala de emergencias.  Nmeros de bper  - Dr. Gwen Pounds: [phone]  - Dra. Roseanne Reno: [phone]  En caso de inclemencias del [place], por favor llame a Lacy Duverney principal al [phone] para una actualizacin sobre el [place] de cualquier retraso o cierre.  Consejos para la medicacin en dermatologa: Por favor, guarde las cajas en las que vienen los medicamentos de uso tpico para ayudarle a seguir las instrucciones sobre dnde y cmo usarlos. Las farmacias generalmente imprimen las instrucciones del medicamento slo en las cajas y no directamente en los tubos del [place].   Si su  medicamento es [place] caro, por favor, pngase en contacto con Rolm Gala llamando al [phone] y presione la opcin 4 o envenos un mensaje a travs de Clinical cytogeneticist.   No podemos decirle cul ser su copago por los medicamentos por adelantado ya que esto es diferente dependiendo de la cobertura de su seguro. Sin embargo, es posible que podamos encontrar un medicamento sustituto a Audiological scientist un formulario para que el seguro cubra el medicamento que se considera necesario.   Si se requiere una autorizacin previa para que su compaa de seguros [place] su medicamento, por favor permtanos de 1 a 2 das hbiles para completar [address]  Los precios de los medicamentos varan con frecuencia dependiendo del Environmental consultant de dnde se surte la receta y alguna farmacias pueden ofrecer precios ms baratos.  El sitio web www.goodrx.com tiene cupones para medicamentos de Health and safety inspector. Los precios aqu no tienen en cuenta lo que podra costar con la ayuda del seguro (puede ser  ms barato con su seguro), pero el sitio web puede darle el precio si no utiliz Tourist information centre manager.  - Puede imprimir el cupn correspondiente y llevarlo con su receta a la farmacia.  - Tambin puede pasar por nuestra oficina durante el horario de atencin regular y Education officer, museum una tarjeta de cupones de GoodRx.  - Si necesita que su receta se enve electrnicamente a una farmacia diferente, informe a nuestra oficina a travs de MyChart de [place] o por telfono llamando al [phone] y presione la opcin 4.

## 2022-11-23 ENCOUNTER — Encounter: Payer: Self-pay | Admitting: Dermatology

## 2023-02-17 ENCOUNTER — Ambulatory Visit: Payer: Medicare HMO | Admitting: Dermatology

## 2023-09-17 ENCOUNTER — Ambulatory Visit
Admission: EM | Admit: 2023-09-17 | Discharge: 2023-09-17 | Disposition: A | Discharge status: Home / Self Care | Attending: Emergency Medicine | Admitting: Emergency Medicine

## 2023-09-17 ENCOUNTER — Ambulatory Visit (INDEPENDENT_AMBULATORY_CARE_PROVIDER_SITE_OTHER)

## 2023-09-17 DIAGNOSIS — Z8673 Personal history of transient ischemic attack (TIA), and cerebral infarction without residual deficits: Secondary | ICD-10-CM | POA: Diagnosis not present

## 2023-09-17 DIAGNOSIS — I10 Essential (primary) hypertension: Secondary | ICD-10-CM | POA: Diagnosis not present

## 2023-09-17 DIAGNOSIS — W57XXXA Bitten or stung by nonvenomous insect and other nonvenomous arthropods, initial encounter: Secondary | ICD-10-CM | POA: Insufficient documentation

## 2023-09-17 DIAGNOSIS — E785 Hyperlipidemia, unspecified: Secondary | ICD-10-CM | POA: Insufficient documentation

## 2023-09-17 DIAGNOSIS — I714 Abdominal aortic aneurysm, without rupture, unspecified: Secondary | ICD-10-CM | POA: Diagnosis not present

## 2023-09-17 DIAGNOSIS — R062 Wheezing: Secondary | ICD-10-CM | POA: Insufficient documentation

## 2023-09-17 DIAGNOSIS — K219 Gastro-esophageal reflux disease without esophagitis: Secondary | ICD-10-CM | POA: Diagnosis not present

## 2023-09-17 DIAGNOSIS — M7989 Other specified soft tissue disorders: Secondary | ICD-10-CM | POA: Diagnosis not present

## 2023-09-17 DIAGNOSIS — R600 Localized edema: Secondary | ICD-10-CM | POA: Insufficient documentation

## 2023-09-17 DIAGNOSIS — I2510 Atherosclerotic heart disease of native coronary artery without angina pectoris: Secondary | ICD-10-CM | POA: Diagnosis not present

## 2023-09-17 DIAGNOSIS — J4489 Other specified chronic obstructive pulmonary disease: Secondary | ICD-10-CM | POA: Diagnosis not present

## 2023-09-17 LAB — COMPREHENSIVE METABOLIC PANEL WITH GFR
ALT: 28 U/L (ref 0–44)
AST: 21 U/L (ref 15–41)
Albumin: 4.1 g/dL (ref 3.5–5.0)
Alkaline Phosphatase: 69 U/L (ref 38–126)
Anion gap: 8 (ref 5–15)
BUN: 30 mg/dL — ABNORMAL HIGH (ref 8–23)
CO2: 25 mmol/L (ref 22–32)
Calcium: 8.8 mg/dL — ABNORMAL LOW (ref 8.9–10.3)
Chloride: 104 mmol/L (ref 98–111)
Creatinine, Ser: 0.77 mg/dL (ref 0.61–1.24)
GFR, Estimated: >60 mL/min (ref >60)
Glucose, Bld: 112 mg/dL — ABNORMAL HIGH (ref 70–99)
Potassium: 4.6 mmol/L (ref 3.5–5.1)
Sodium: 137 mmol/L (ref 135–145)
Total Bilirubin: 0.7 mg/dL (ref 0.0–1.2)
Total Protein: 6.6 g/dL (ref 6.5–8.1)

## 2023-09-17 LAB — CBC WITH DIFFERENTIAL/PLATELET
Abs Immature Granulocytes: 0.09 10*3/uL — ABNORMAL HIGH (ref 0.00–0.07)
Basophils Absolute: 0 10*3/uL (ref 0.0–0.1)
Basophils Relative: 1 %
Eosinophils Absolute: 0 10*3/uL (ref 0.0–0.5)
Eosinophils Relative: 1 %
HCT: 34.8 % — ABNORMAL LOW (ref 39.0–52.0)
Hemoglobin: 11.4 g/dL — ABNORMAL LOW (ref 13.0–17.0)
Immature Granulocytes: 2 %
Lymphocytes Relative: 13 %
Lymphs Abs: 0.6 10*3/uL — ABNORMAL LOW (ref 0.7–4.0)
MCH: 29.8 pg (ref 26.0–34.0)
MCHC: 32.8 g/dL (ref 30.0–36.0)
MCV: 91.1 fL (ref 80.0–100.0)
Monocytes Absolute: 0.8 10*3/uL (ref 0.1–1.0)
Monocytes Relative: 16 %
Neutro Abs: 3.3 10*3/uL (ref 1.7–7.7)
Neutrophils Relative %: 67 %
Platelets: 132 10*3/uL — ABNORMAL LOW (ref 150–400)
RBC: 3.82 MIL/uL — ABNORMAL LOW (ref 4.22–5.81)
RDW: 19.3 % — ABNORMAL HIGH (ref 11.5–15.5)
WBC: 4.8 10*3/uL (ref 4.0–10.5)
nRBC: 0 % (ref 0.0–0.2)

## 2023-09-17 MED ORDER — DOXYCYCLINE HYCLATE 100 MG PO CAPS: 100.0000 mg | ORAL_CAPSULE | Freq: Two times a day (BID) | ORAL | 0 refills | Status: AC

## 2023-09-17 NOTE — ED Triage Notes (Addendum)
 Pt is with his great granddaughter  Pt c/o right leg and right foot swelling x2weeks  Pt states that he has had a lot of tick bites  Pt does not know his medications

## 2023-09-17 NOTE — Discharge Instructions (Addendum)
 Your blood work was reassuring as it shows no elevation of systemic infection.  You do have some mild anemia but it has been stable when compared to other labs.  You do have a mild increase in your BUN which indicates that you have not been drinking enough water.  Increase your water intake to a goal of 64 ounces of water daily.  More if you are going to be outside in the heat.  Because you have open sores on your lower legs and you have been bitten by a tick I am going to start you on doxycycline 100 mg twice daily with food for 10 days.  Your blood work for tickborne illness will be back in the next few days and those results will determine if you need a longer course of antibiotic therapy or not.  Elevate your lower extremities as much as possible to help decrease swelling.  I recommend that you purchase compression socks and wear them to help pump the fluid from your feet and legs back into your circulation so that your body can eliminate it.  Pick up your medication from the pharmacy and resume taking your medication that has been previously prescribed for control of your blood pressure, overactive bladder, and high cholesterol.  You need to make an appointment to follow-up with your primary care provider regarding the swelling in your lower extremities.  They may want to do ultrasounds of your lower extremities to look at your venous system.  Given that your extremities are warm and your pulses are strong I do not suspect any arterial insufficiency.

## 2023-09-17 NOTE — ED Provider Notes (Signed)
 [facility]    CSN: [id number] Arrival date & time: 09/17/23  0919      History   Chief Complaint Chief Complaint  Patient presents with   Leg Swelling    HPI CHASEN MENDELL is a 83 y.o. male.   HPI  83 year old male with past medical history significant for hypertension, CAD, COPD, asthma, GERD, hyperlipidemia, TIA, squamous cell carcinoma, Barrett's esophagus, and abdominal aortic aneurysm presents with his great granddaughter for evaluation of swelling to his right leg and swelling to both feet that have been going on for the last 2 weeks.  He reports that he has suffered multiple tick bites.  No associated fever or rashes.  No cough or shortness of breath.  Past Medical History:  Diagnosis Date   Abdominal aortic aneurysm without rupture (HCC)    Asthma    Barrett's esophagus    COPD (chronic obstructive pulmonary disease) (HCC)    Coronary artery disease    Depression    GERD (gastroesophageal reflux disease)    Hallux valgus, acquired    Headache    Hyperlipidemia    Hypertension    Squamous cell carcinoma of skin 11/08/2019   WD SCC. Right dorsal forearm.    TIA (transient ischemic attack)     Patient Active Problem List   Diagnosis Date Noted   Benign prostatic hyperplasia 06/08/2017    Past Surgical History:  Procedure Laterality Date   COLONOSCOPY     COLONOSCOPY N/A 05/27/2020   Procedure: COLONOSCOPY;  Surgeon: Toledo, Alphonsus Jeans, MD;  Location: [facility];  Service: Gastroenterology;  Laterality: N/A;   COLONOSCOPY WITH PROPOFOL  N/A 01/27/2017   Procedure: COLONOSCOPY WITH PROPOFOL ;  Surgeon: Toledo, Alphonsus Jeans, MD;  Location: [facility];  Service: Gastroenterology;  Laterality: N/A;   CORONARY ANGIOPLASTY     CORONARY STENT PLACEMENT     ESOPHAGOGASTRODUODENOSCOPY (EGD) WITH PROPOFOL  N/A 01/27/2017   Procedure: ESOPHAGOGASTRODUODENOSCOPY (EGD) WITH PROPOFOL ;  Surgeon: Toledo, Alphonsus Jeans, MD;  Location: [facility];  Service:  Gastroenterology;  Laterality: N/A;   ESOPHAGOGASTRODUODENOSCOPY (EGD) WITH PROPOFOL  N/A 05/27/2020   Procedure: ESOPHAGOGASTRODUODENOSCOPY (EGD) WITH PROPOFOL ;  Surgeon: Toledo, Alphonsus Jeans, MD;  Location: [facility];  Service: Gastroenterology;  Laterality: N/A;   EYE SURGERY     rt cataract extraction   HERNIA REPAIR     PROSTATE SURGERY     PROSTATE SURGERY     SHOULDER SURGERY     tdap     UPPER GASTROINTESTINAL ENDOSCOPY         Home Medications    Prior to Admission medications   Medication Sig Start Date End Date Taking? Authorizing Provider  amLODipine (NORVASC) 5 MG tablet Take 5 mg by mouth daily.   Yes [provider]  aspirin EC 81 MG tablet Take 1 tablet by mouth daily.   Yes [provider]  doxycycline (VIBRAMYCIN) 100 MG capsule Take 1 capsule (100 mg total) by mouth 2 (two) times daily. 09/17/23  Yes Kent Pear, NP  omeprazole (PRILOSEC) 20 MG capsule Take 1 capsule by mouth daily. 10/24/15  Yes [provider]  oxybutynin  (DITROPAN -XL) 10 MG 24 hr tablet Take 10 mg by mouth 3 (three) times daily.   Yes [provider]  simvastatin (ZOCOR) 10 MG tablet Take 10 mg by mouth at bedtime.   Yes [provider]  venlafaxine  XR (EFFEXOR -XR) 150 MG 24 hr capsule Take 150 mg by mouth daily with breakfast. 07/21/16  Yes [provider]  Family History Family History  Problem Relation Age of Onset   Stroke Father     Social History Social History   Tobacco Use   Smoking status: Former    Current packs/day: 0.00    Types: Cigarettes    Quit date: 04/06/1984    Years since quitting: 39.4   Smokeless tobacco: Never  Vaping Use   Vaping status: Never Used  Substance Use Topics   Alcohol use: No   Drug use: No     Allergies   Aspirin-caffeine, Other, and Zoloft [sertraline]   Review of Systems Review of Systems  Constitutional:  Negative for fever.  Respiratory:  Negative for cough, shortness of breath  and wheezing.   Cardiovascular:  Positive for leg swelling. Negative for chest pain.  Skin:  Positive for color change and wound. Negative for rash.     Physical Exam Triage Vital Signs ED Triage Vitals  Encounter Vitals Group     BP      Girls Systolic BP Percentile      Girls Diastolic BP Percentile      Boys Systolic BP Percentile      Boys Diastolic BP Percentile      Pulse      Resp      Temp      Temp src      SpO2      Weight      Height      Head Circumference      Peak Flow      Pain Score      Pain Loc      Pain Education      Exclude from Growth Chart    No data found.  Updated Vital Signs BP 132/65 (BP Location: Left Arm)   Pulse (!) 56   Temp 97.9 F (36.6 C) (Oral)   Wt 170 lb (77.1 kg)   SpO2 97%   BMI 24.39 kg/m   Visual Acuity Right Eye Distance:   Left Eye Distance:   Bilateral Distance:    Right Eye Near:   Left Eye Near:    Bilateral Near:     Physical Exam Vitals and nursing note reviewed.  Constitutional:      Appearance: Normal appearance. He is not ill-appearing.  HENT:     Head: Normocephalic and atraumatic.   Cardiovascular:     Rate and Rhythm: Normal rate and regular rhythm.     Pulses: Normal pulses.     Heart sounds: Normal heart sounds. No murmur heard.    No friction rub. No gallop.  Pulmonary:     Effort: Pulmonary effort is normal.     Breath sounds: Wheezing present. No rhonchi or rales.     Comments: Scattered expiratory wheezing in upper middle lung fields bilaterally.  Musculoskeletal:     Right lower leg: Edema present.     Left lower leg: Edema present.     Comments: Patient has bilateral lower extremity edema with 1+ anterior tibial pitting from ankles to knees.   Skin:    General: Skin is warm and dry.     Capillary Refill: Capillary refill takes less than 2 seconds.     Findings: Erythema present.   Neurological:     General: No focal deficit present.     Mental Status: He is alert and oriented  to person, place, and time.      UC Treatments / Results  Labs (all labs ordered are listed, but only abnormal  results are displayed) Labs Reviewed  CBC WITH DIFFERENTIAL/PLATELET - Abnormal; Notable for the following components:      Result Value   RBC 3.82 (*)    Hemoglobin 11.4 (*)    HCT 34.8 (*)    RDW 19.3 (*)    Platelets 132 (*)    Lymphs Abs 0.6 (*)    Abs Immature Granulocytes 0.09 (*)    All other components within normal limits  COMPREHENSIVE METABOLIC PANEL WITH GFR - Abnormal; Notable for the following components:   Glucose, Bld 112 (*)    BUN 30 (*)    Calcium 8.8 (*)    All other components within normal limits  LYME DISEASE SEROLOGY W/REFLEX  EHRLICHIA ANTIBODY PANEL  BARTONELLA ANTIBODY PANEL  ROCKY MTN SPOTTED FVR ABS PNL(IGG+IGM)    EKG Sinus rhythm with Ventricular rate 61 bpm PR interval 208 ms QRS duration 140 ms QT/QTc 438/440 ms Right bundle branch block and blocked premature atrial complexes with premature supraventricular complexes.  Radiology DG Chest 2 View Result Date: 09/17/2023 CLINICAL DATA:  Wheezing and bilateral leg swelling. History of COPD. EXAM: CHEST - 2 VIEW COMPARISON:  08/05/2015. FINDINGS: The heart size and mediastinal contours are within normal limits. Aortic atherosclerosis. Emphysema. Stable calcified granuloma in the right lower lateral lung. No pulmonary edema, focal consolidation, pleural effusion, or pneumothorax. No acute osseous abnormality. IMPRESSION: 1. No acute cardiopulmonary findings. 2. Emphysema. Electronically Signed   By: Mannie Seek M.D.   On: 09/17/2023 11:00    Procedures Procedures (including critical care time)  Medications Ordered in UC Medications - No data to display  Initial Impression / Assessment and Plan / UC Course  I have reviewed the triage vital signs and the nursing notes.  Pertinent labs & imaging results that were available during my care of the patient were reviewed by me and  considered in my medical decision making (see chart for details).   Patient is a pleasant, hard of hearing, 83 year old male presenting for evaluation of leg swelling and multiple tick bites with his great granddaughter.  He is very hard of hearing despite wearing hearing aids and he is unsure of his medications though has a list in his wallet.  He reports that he has not taken his medications in the last 1 to 2 weeks as he is not going to the pharmacy to pick them up.  He is prescribed 5 mg of amlodipine daily but has not taken that in the last 7 to 14 days.  He is not in any acute distress and he is able to speak in full sentences without dyspnea or tachypnea.  His respiratory rate is 16 with a 97% room air oxygen saturation.  Chest auscultation reveals S1-S2 heart sounds with regular rate and rhythm and he does have scattered expiratory wheezing in bilateral upper and middle lung fields.  No rhonchi or crackles noted.  He does have 1+ pitting anterior tibial edema from ankles to knees.  His feet are also mildly swollen.    I can see the images above, he does have edema and redness to both lower extremities as well as multiple scabbed or open lesions.  There is some clear fluid on the anterior tibia.  Cap refill is 5 seconds.  DP and PT pulses bilaterally are 2+ and strong.  As you can see noted above, there is also a scabbed lesion on the upper inner right thigh which the patient indicates is from a tick bite.  He reports  that he has had multiple tick bites in the last 2 weeks.  The etiology of the patient's leg swelling is unclear.  I do not think it is secondary to his calcium channel blocker since he has not taken it in the last 2 weeks.  Given the decreased capillary refill I suspect it is most likely venous insufficiency.  Given his warm extremities and strong pulses I do not feel it is necessary to perform Doppler studies at this time.  Given his wheezing and history of COPD I will obtain a chest  x-ray to evaluate for any acute cardiopulmonary pathology.  I will also order blood work to look for systemic infection given his multiple tick bites as well as any electrolyte abnormality or renal dysfunction which could be contributing to his peripheral edema.  Additionally, I will obtain an EKG to evaluate for any cardiac abnormality which could be contributing to his symptoms.  I will also order a tick panel to assess for Lyme, RMSF, Bartonella, and Ehrlichia.  Patient's EKG shows right bundle branch block which was also present on EKG dated 10/02/2022.  There the machine reading sinus rhythm with first-degree AV block with similar appearance to today's EKG.  Given that the PR interval is 208 ms this is sinus rhythm with first-degree AV block.  Chest x-ray independently reviewed and evaluated by me.  Impression: The lung fields are well aerated without evidence of infiltrate or effusion.  There is a calcification in the right lung base as well as in the apex of the left lung.  Aortic arch atherosclerosis present.  Radiology overread is pending. Radiology impression states no acute pulmonary findings, stable calcified granuloma in the right lower lateral lung.  Emphysema.  Chest x-ray was compared to x-ray performed at [facility] dated 08/05/2015 which also showed the calcification that was identified as a granuloma by radiology.  The calcification in the apex of the left lung was also present though it was not commented on in the read.  CBC shows a normal white count of 4.8.  Patient has a low red count of 3.82 with a low H&H of 11.4 and 34.8.  RDW is increased at 19.3.  Platelets low at 132.  Review of records in epic show that patient has had anemia and thrombocytopenia in the past though his platelet count has improved over last available blood draw from 10/28/2022.  At that time his platelets were 125.  H&H is similar at 11.4 and 34.8 versus 12.2 and 36.5 1-year ago.  CMP shows normal electrolytes.   Glucose is mildly elevated at 112.  BUN is 30 but creatinine is normal at 0.77.  Mild hypocalcemia at 8.8.  No abnormalities to his transaminases.  Review of epic shows that patient has been experiencing hypocalcemia since 2017.  This is an interval improvement over his level of 8.6 1-year ago.  I will discharge the patient home with a diagnosis of peripheral edema and will have him follow-up with his primary care provider for further workup and imaging.  Given the tick exposure and the erythema to the lower extremities I will place patient on doxycycline 100 mg twice daily for 10 days prophylactically while his tick labs are pending.  I will also encourage him to elevate his feet is much as possible to help with the swelling.  Additionally, compression socks may be helpful to prevent worsening of the edema.   Final Clinical Impressions(s) / UC Diagnoses   Final diagnoses:  Wheezing  Peripheral  edema  Tick bite, unspecified site, initial encounter     Discharge Instructions      Your blood work was reassuring as it shows no elevation of systemic infection.  You do have some mild anemia but it has been stable when compared to other labs.  You do have a mild increase in your BUN which indicates that you have not been drinking enough water.  Increase your water intake to a goal of 64 ounces of water daily.  More if you are going to be outside in the heat.  Because you have open sores on your lower legs and you have been bitten by a tick I am going to start you on doxycycline 100 mg twice daily with food for 10 days.  Your blood work for tickborne illness will be back in the next few days and those results will determine if you need a longer course of antibiotic therapy or not.  Elevate your lower extremities as much as possible to help decrease swelling.  I recommend that you purchase compression socks and wear them to help pump the fluid from your feet and legs back into your circulation  so that your body can eliminate it.  Pick up your medication from the pharmacy and resume taking your medication that has been previously prescribed for control of your blood pressure, overactive bladder, and high cholesterol.  You need to make an appointment to follow-up with your primary care provider regarding the swelling in your lower extremities.  They may want to do ultrasounds of your lower extremities to look at your venous system.  Given that your extremities are warm and your pulses are strong I do not suspect any arterial insufficiency.     ED Prescriptions     Medication Sig Dispense Auth. Provider   doxycycline (VIBRAMYCIN) 100 MG capsule Take 1 capsule (100 mg total) by mouth 2 (two) times daily. 20 capsule Kent Pear, NP      PDMP not reviewed this encounter.   Kent Pear, NP 09/17/23 1127

## 2023-09-20 LAB — LYME DISEASE SEROLOGY W/REFLEX: Lyme Total Antibody EIA: NEGATIVE

## 2023-09-21 LAB — BARTONELLA ANTIBODY PANEL
B Quintana IgM: NEGATIVE {titer}
B henselae IgG: NEGATIVE {titer}
B henselae IgM: NEGATIVE {titer}
B quintana IgG: NEGATIVE {titer}

## 2023-09-21 LAB — EHRLICHIA ANTIBODY PANEL
E chaffeensis (HGE) Ab, IgG: NEGATIVE
E chaffeensis (HGE) Ab, IgM: NEGATIVE
E. Chaffeensis (HME) IgM Titer: NEGATIVE
E.Chaffeensis (HME) IgG: NEGATIVE

## 2023-12-01 ENCOUNTER — Ambulatory Visit: Payer: Medicare HMO | Admitting: Dermatology
